# Patient Record
Sex: Female | Born: 1955 | Race: White | Hispanic: No | Smoking: Current every day smoker
Health system: Southern US, Community
[De-identification: ages and names within clinical notes are randomized; demographics above are authoritative.]

## PROBLEM LIST (undated history)

## (undated) DIAGNOSIS — Z720 Tobacco use: Secondary | ICD-10-CM

## (undated) DIAGNOSIS — G4730 Sleep apnea, unspecified: Secondary | ICD-10-CM

## (undated) DIAGNOSIS — R0600 Dyspnea, unspecified: Secondary | ICD-10-CM

## (undated) DIAGNOSIS — T7840XA Allergy, unspecified, initial encounter: Secondary | ICD-10-CM

## (undated) DIAGNOSIS — H269 Unspecified cataract: Secondary | ICD-10-CM

## (undated) DIAGNOSIS — C801 Malignant (primary) neoplasm, unspecified: Secondary | ICD-10-CM

## (undated) DIAGNOSIS — R3915 Urgency of urination: Secondary | ICD-10-CM

## (undated) DIAGNOSIS — E079 Disorder of thyroid, unspecified: Secondary | ICD-10-CM

## (undated) DIAGNOSIS — Z0389 Encounter for observation for other suspected diseases and conditions ruled out: Secondary | ICD-10-CM

## (undated) DIAGNOSIS — Z780 Asymptomatic menopausal state: Secondary | ICD-10-CM

## (undated) DIAGNOSIS — R002 Palpitations: Secondary | ICD-10-CM

## (undated) DIAGNOSIS — M1990 Unspecified osteoarthritis, unspecified site: Secondary | ICD-10-CM

## (undated) DIAGNOSIS — F419 Anxiety disorder, unspecified: Secondary | ICD-10-CM

## (undated) DIAGNOSIS — L309 Dermatitis, unspecified: Secondary | ICD-10-CM

## (undated) DIAGNOSIS — E039 Hypothyroidism, unspecified: Secondary | ICD-10-CM

## (undated) HISTORY — DX: Tobacco use: Z72.0

## (undated) HISTORY — DX: Dyspnea, unspecified: R06.00

## (undated) HISTORY — DX: Allergy, unspecified, initial encounter: T78.40XA

## (undated) HISTORY — PX: CATARACT EXTRACTION: SUR2

## (undated) HISTORY — DX: Dermatitis, unspecified: L30.9

## (undated) HISTORY — DX: Urgency of urination: R39.15

## (undated) HISTORY — DX: Palpitations: R00.2

## (undated) HISTORY — DX: Asymptomatic menopausal state: Z78.0

## (undated) HISTORY — DX: Encounter for observation for other suspected diseases and conditions ruled out: Z03.89

## (undated) HISTORY — DX: Anxiety disorder, unspecified: F41.9

## (undated) HISTORY — DX: Unspecified osteoarthritis, unspecified site: M19.90

## (undated) HISTORY — PX: WISDOM TOOTH EXTRACTION: SHX21

## (undated) HISTORY — DX: Unspecified cataract: H26.9

---

## 1980-05-14 HISTORY — PX: CHOLECYSTECTOMY: SHX55

## 1999-04-28 ENCOUNTER — Encounter: Admission: RE | Admit: 1999-04-28 | Discharge: 1999-04-28 | Payer: Self-pay | Admitting: Family Medicine

## 1999-04-28 ENCOUNTER — Encounter: Payer: Self-pay | Admitting: Family Medicine

## 1999-05-11 ENCOUNTER — Encounter: Payer: Self-pay | Admitting: Family Medicine

## 1999-05-11 ENCOUNTER — Encounter: Admission: RE | Admit: 1999-05-11 | Discharge: 1999-05-11 | Payer: Self-pay | Admitting: Family Medicine

## 1999-06-07 ENCOUNTER — Other Ambulatory Visit: Admission: RE | Admit: 1999-06-07 | Discharge: 1999-07-03 | Payer: Self-pay | Admitting: *Deleted

## 2000-05-20 ENCOUNTER — Encounter: Payer: Self-pay | Admitting: Family Medicine

## 2000-05-20 ENCOUNTER — Encounter: Admission: RE | Admit: 2000-05-20 | Discharge: 2000-05-20 | Payer: Self-pay | Admitting: Family Medicine

## 2001-01-20 ENCOUNTER — Other Ambulatory Visit: Admission: RE | Admit: 2001-01-20 | Discharge: 2001-01-20 | Payer: Self-pay | Admitting: *Deleted

## 2001-07-07 ENCOUNTER — Encounter: Admission: RE | Admit: 2001-07-07 | Discharge: 2001-07-07 | Payer: Self-pay | Admitting: Family Medicine

## 2001-07-07 ENCOUNTER — Encounter: Payer: Self-pay | Admitting: Family Medicine

## 2002-08-10 ENCOUNTER — Other Ambulatory Visit: Admission: RE | Admit: 2002-08-10 | Discharge: 2002-08-10 | Payer: Self-pay | Admitting: Obstetrics and Gynecology

## 2002-08-17 ENCOUNTER — Encounter: Payer: Self-pay | Admitting: Obstetrics and Gynecology

## 2002-08-17 ENCOUNTER — Encounter: Admission: RE | Admit: 2002-08-17 | Discharge: 2002-08-17 | Payer: Self-pay | Admitting: Obstetrics and Gynecology

## 2003-08-30 ENCOUNTER — Other Ambulatory Visit: Admission: RE | Admit: 2003-08-30 | Discharge: 2003-08-30 | Payer: Self-pay | Admitting: Obstetrics and Gynecology

## 2004-09-25 ENCOUNTER — Encounter: Admission: RE | Admit: 2004-09-25 | Discharge: 2004-09-25 | Payer: Self-pay | Admitting: Family Medicine

## 2004-10-02 ENCOUNTER — Encounter: Admission: RE | Admit: 2004-10-02 | Discharge: 2004-10-02 | Payer: Self-pay | Admitting: Family Medicine

## 2004-10-05 ENCOUNTER — Other Ambulatory Visit: Admission: RE | Admit: 2004-10-05 | Discharge: 2004-10-05 | Payer: Self-pay | Admitting: Family Medicine

## 2005-11-19 ENCOUNTER — Encounter: Admission: RE | Admit: 2005-11-19 | Discharge: 2005-11-19 | Payer: Self-pay | Admitting: Family Medicine

## 2005-12-17 ENCOUNTER — Other Ambulatory Visit: Admission: RE | Admit: 2005-12-17 | Discharge: 2005-12-17 | Payer: Self-pay | Admitting: Family Medicine

## 2007-08-20 ENCOUNTER — Ambulatory Visit (HOSPITAL_COMMUNITY): Admission: RE | Admit: 2007-08-20 | Discharge: 2007-08-20 | Payer: Self-pay | Admitting: Family Medicine

## 2008-07-23 ENCOUNTER — Other Ambulatory Visit: Admission: RE | Admit: 2008-07-23 | Discharge: 2008-07-23 | Payer: Self-pay | Admitting: Family Medicine

## 2008-08-23 ENCOUNTER — Ambulatory Visit (HOSPITAL_COMMUNITY): Admission: RE | Admit: 2008-08-23 | Discharge: 2008-08-23 | Payer: Self-pay | Admitting: Family Medicine

## 2009-09-01 ENCOUNTER — Other Ambulatory Visit: Admission: RE | Admit: 2009-09-01 | Discharge: 2009-09-01 | Payer: Self-pay | Admitting: Family Medicine

## 2009-09-01 ENCOUNTER — Encounter: Admission: RE | Admit: 2009-09-01 | Discharge: 2009-09-01 | Payer: Self-pay | Admitting: Family Medicine

## 2010-09-14 ENCOUNTER — Other Ambulatory Visit (HOSPITAL_COMMUNITY): Payer: Self-pay | Admitting: Family Medicine

## 2010-09-14 DIAGNOSIS — Z1231 Encounter for screening mammogram for malignant neoplasm of breast: Secondary | ICD-10-CM

## 2010-09-25 ENCOUNTER — Ambulatory Visit (HOSPITAL_COMMUNITY)
Admission: RE | Admit: 2010-09-25 | Discharge: 2010-09-25 | Disposition: A | Discharge status: Home / Self Care | Payer: Self-pay | Source: Ambulatory Visit | Attending: Family Medicine | Admitting: Family Medicine

## 2010-09-25 DIAGNOSIS — Z1231 Encounter for screening mammogram for malignant neoplasm of breast: Secondary | ICD-10-CM

## 2010-10-06 HISTORY — PX: OTHER SURGICAL HISTORY: SHX169

## 2011-08-16 ENCOUNTER — Ambulatory Visit (INDEPENDENT_AMBULATORY_CARE_PROVIDER_SITE_OTHER): Payer: BC Managed Care – PPO | Admitting: Internal Medicine

## 2011-08-16 ENCOUNTER — Encounter: Payer: Self-pay | Admitting: Internal Medicine

## 2011-08-16 VITALS — BP 150/84 | HR 85 | Temp 98.5°F | Ht 65.0 in | Wt 208.1 lb

## 2011-08-16 DIAGNOSIS — I10 Essential (primary) hypertension: Secondary | ICD-10-CM

## 2011-08-16 DIAGNOSIS — R002 Palpitations: Secondary | ICD-10-CM

## 2011-08-16 DIAGNOSIS — E669 Obesity, unspecified: Secondary | ICD-10-CM

## 2011-08-16 DIAGNOSIS — M1990 Unspecified osteoarthritis, unspecified site: Secondary | ICD-10-CM

## 2011-08-16 DIAGNOSIS — Z0000 Encounter for general adult medical examination without abnormal findings: Secondary | ICD-10-CM

## 2011-08-16 DIAGNOSIS — M129 Arthropathy, unspecified: Secondary | ICD-10-CM

## 2011-08-16 LAB — POCT URINALYSIS DIPSTICK
Bilirubin, UA: NEGATIVE
Blood, UA: NEGATIVE
Leukocytes, UA: NEGATIVE
Nitrite, UA: NEGATIVE
Urobilinogen, UA: NEGATIVE
pH, UA: 6

## 2011-08-16 NOTE — Progress Notes (Signed)
Subjective:    Patient ID: Jordan Rodgers, female    DOB: Dec 13, 1955, 56 y.o.   MRN: [id number]  HPI New pt here for first  Visit and comprehensive eval.  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  See BP - she reports she has been told that she has High blood pressure in the past but has not been on meds.  She is also concerned about 30# weight gain and palpitations of her hear.   She denies chest pain or pressure, no radiation down L arm or jaw, some DOE (she is a long time smoker), no diaphroesis or N/V. When asked if anyone has done a recent CXR on pt. , pt states "I dont' want a CXR   I don't give a damn what's in there."  She is asymptomatic from her elevated BP.  No headaches no chest pain no LE edema.  She does not want her blood drawn as it has recently been drawn at [facility]'s office.  She declines pap today stating she had one last year and it was normal. No history of abnormal pap smears per pt. report  No Known Allergies Past Medical History  Diagnosis Date  . Anxiety   . Menopause    Past Surgical History  Procedure Date  . Cholecystectomy 1982   History   Social History  . Marital Status: Married    Spouse Name: N/A    Number of Children: N/A  . Years of Education: N/A   Occupational History  . Not on file.   Social History Main Topics  . Smoking status: Current Everyday Smoker  . Smokeless tobacco: Never Used  . Alcohol Use: No  . Drug Use: No  . Sexually Active: Yes   Other Topics Concern  . Not on file   Social History Narrative  . No narrative on file   Family History  Problem Relation Age of Onset  . Hypertension Mother   . Thyroid disease Mother   . Hypertension Father   . Dementia Father     Lewy Body  . Thyroid disease Daughter   . Thyroid disease Maternal Aunt    There is no problem list on file for this patient.  Current Outpatient Prescriptions on File Prior to Visit    Medication Sig Dispense Refill  . Calcium Carbonate-Vitamin D (CALCIUM 600+D HIGH POTENCY) 600-400 MG-UNIT per tablet Take 1 tablet by mouth daily.      . clonazePAM (KLONOPIN) 1 MG tablet Take 0.25 mg by mouth 2 (two) times daily as needed.           Review of Systems See HPI    Objective:   Physical Exam  Physical Exam  Nursing note and vitals reviewed.  Constitutional: She is oriented to person, place, and time. She appears well-developed and well-nourished.  HENT:  Head: Normocephalic and atraumatic.  Right Ear: Tympanic membrane and ear canal normal. No drainage. Tympanic membrane is not injected and not erythematous.  Left Ear: Tympanic membrane and ear canal normal. No drainage. Tympanic membrane is not injected and not erythematous.  Nose: Nose normal. Right sinus exhibits no maxillary sinus tenderness and no frontal sinus tenderness. Left sinus exhibits no maxillary sinus tenderness and no frontal sinus tenderness.  Mouth/Throat: Oropharynx is clear and moist. No oral lesions. No oropharyngeal exudate.  Eyes: Conjunctivae and EOM are normal. Pupils are equal, round, and reactive to light.  Neck: Normal range of motion. Neck  supple. No JVD present. Carotid bruit is not present. No mass and no thyromegaly present.  Cardiovascular: Normal rate, regular rhythm, S1 normal, S2 normal and intact distal pulses. Exam reveals no gallop and no friction rub.  No murmur heard.  Pulses:  Carotid pulses are 2+ on the right side, and 2+ on the left side.  Dorsalis pedis pulses are 2+ on the right side, and 2+ on the left side.  No carotid bruit. No LE edema  Pulmonary/Chest: Breath sounds normal. She has no wheezes. She has no rales. She exhibits no tenderness.  Breasts:  No discrete masses no nipple discharge no axillary adenoapthy bilaterally Abdominal: Soft. Bowel sounds are normal. She exhibits no distension and no mass. There is no hepatosplenomegaly. There is no tenderness. There is  no CVA tenderness. Rectal: No mass guaiac neg M/S  Normal range of motion No active synovitisi to any jointhadenopathy:  She has no cervical adenopathy.  She has no axillary adenopathy.  Right: No inguinal and no supraclavicular adenopathy present.  Left: No inguinal and no supraclavicular adenopathy present.  Neurological: She is alert and oriented to person, place, and time. She has normal strength and normal reflexes. She displays no tremor. No cranial nerve deficit or sensory deficit. Coordination and gait normal.  Skin: Skin is warm and dry. No rash noted. No cyanosis. Nails show no clubbing.  Psychiatric: She has a normal mood and affect. Her speech is normal and behavior is normal. Cognition and memory are normal.     Assessment & Plan:  1)  Health Maintenance:  See Scanned HM sheet.  Discussed necessity of Tdap and she wishes to check with Health Department.  She states if she has not had one , she'll get one there.  Info given on Zoster and Dash diet.  She reports colonoscopy done 2012 and mammogram done 09/2010.  She declines pap today 2)  HTN:  Jordan Rodgers reports to me that she would like to try metprolol which she has at home as her husband take same med.  EKG show SR with slight QRS widening inferiorly and anteriorly.  I instructed pt to cal with the dose of metoprolol and we would discuss what dose to start.  She is to call upon arrival home or on Monday. 3)  Palpitations:  Decrease caffiene use.  Instructed if any LOC, chest pain to return to office for EKG  4)  Tobacco use  Not interested in cessation at this time 5)  Obesity:  Will need to discuss at subsequent visit.

## 2011-08-18 ENCOUNTER — Encounter: Payer: Self-pay | Admitting: Internal Medicine

## 2011-08-18 DIAGNOSIS — R002 Palpitations: Secondary | ICD-10-CM | POA: Insufficient documentation

## 2011-08-18 DIAGNOSIS — M1990 Unspecified osteoarthritis, unspecified site: Secondary | ICD-10-CM | POA: Insufficient documentation

## 2011-08-18 DIAGNOSIS — F419 Anxiety disorder, unspecified: Secondary | ICD-10-CM | POA: Insufficient documentation

## 2011-08-18 DIAGNOSIS — Z780 Asymptomatic menopausal state: Secondary | ICD-10-CM | POA: Insufficient documentation

## 2011-08-18 DIAGNOSIS — Z720 Tobacco use: Secondary | ICD-10-CM | POA: Insufficient documentation

## 2011-08-18 DIAGNOSIS — E669 Obesity, unspecified: Secondary | ICD-10-CM | POA: Insufficient documentation

## 2011-08-20 ENCOUNTER — Other Ambulatory Visit (HOSPITAL_COMMUNITY): Payer: Self-pay | Admitting: Physician Assistant

## 2011-08-20 DIAGNOSIS — Z1231 Encounter for screening mammogram for malignant neoplasm of breast: Secondary | ICD-10-CM

## 2011-10-01 ENCOUNTER — Ambulatory Visit (HOSPITAL_COMMUNITY)
Admission: RE | Admit: 2011-10-01 | Discharge: 2011-10-01 | Disposition: A | Discharge status: Home / Self Care | Payer: BC Managed Care – PPO | Source: Ambulatory Visit | Attending: Physician Assistant | Admitting: Physician Assistant

## 2011-10-01 DIAGNOSIS — Z1231 Encounter for screening mammogram for malignant neoplasm of breast: Secondary | ICD-10-CM

## 2011-11-05 ENCOUNTER — Encounter: Payer: Self-pay | Admitting: Internal Medicine

## 2011-11-17 ENCOUNTER — Encounter: Payer: Self-pay | Admitting: Internal Medicine

## 2011-11-17 DIAGNOSIS — M47812 Spondylosis without myelopathy or radiculopathy, cervical region: Secondary | ICD-10-CM | POA: Insufficient documentation

## 2012-09-02 ENCOUNTER — Other Ambulatory Visit (HOSPITAL_COMMUNITY): Payer: Self-pay | Admitting: Physician Assistant

## 2012-09-02 DIAGNOSIS — Z1231 Encounter for screening mammogram for malignant neoplasm of breast: Secondary | ICD-10-CM

## 2012-10-13 ENCOUNTER — Ambulatory Visit (HOSPITAL_COMMUNITY): Payer: BC Managed Care – PPO

## 2012-10-17 ENCOUNTER — Ambulatory Visit (HOSPITAL_COMMUNITY)
Admission: RE | Admit: 2012-10-17 | Discharge: 2012-10-17 | Disposition: A | Discharge status: Home / Self Care | Payer: BC Managed Care – PPO | Source: Ambulatory Visit | Attending: Physician Assistant | Admitting: Physician Assistant

## 2012-10-17 ENCOUNTER — Other Ambulatory Visit (HOSPITAL_COMMUNITY)
Admission: RE | Admit: 2012-10-17 | Discharge: 2012-10-17 | Disposition: A | Discharge status: Home / Self Care | Payer: BC Managed Care – PPO | Source: Ambulatory Visit | Attending: Family Medicine | Admitting: Family Medicine

## 2012-10-17 ENCOUNTER — Other Ambulatory Visit: Payer: Self-pay | Admitting: Physician Assistant

## 2012-10-17 DIAGNOSIS — Z1231 Encounter for screening mammogram for malignant neoplasm of breast: Secondary | ICD-10-CM | POA: Insufficient documentation

## 2012-10-17 DIAGNOSIS — Z124 Encounter for screening for malignant neoplasm of cervix: Secondary | ICD-10-CM | POA: Insufficient documentation

## 2013-07-27 ENCOUNTER — Telehealth: Payer: Self-pay | Admitting: Cardiology

## 2013-07-27 NOTE — Telephone Encounter (Signed)
New problem   Pt need to speak to Dr Gillian Shields concerning getting an appt for a friend Jamey Ripa 05/31/1949 that is here visiting and he lives at [facility]. She stated she has a referral from friends doctor. Please call pt she stated she new dr Gillian Shields and was a pt of his.

## 2013-07-28 NOTE — Telephone Encounter (Signed)
Spoke with patient she advised that she had spoken to Dr. Marlou Porch and her friend went to a walk-in clinic.

## 2013-10-07 ENCOUNTER — Other Ambulatory Visit (HOSPITAL_COMMUNITY): Payer: Self-pay | Admitting: Physician Assistant

## 2013-10-07 DIAGNOSIS — Z1231 Encounter for screening mammogram for malignant neoplasm of breast: Secondary | ICD-10-CM

## 2013-10-19 ENCOUNTER — Ambulatory Visit (HOSPITAL_COMMUNITY)
Admission: RE | Admit: 2013-10-19 | Discharge: 2013-10-19 | Disposition: A | Discharge status: Home / Self Care | Payer: BC Managed Care – PPO | Source: Ambulatory Visit | Attending: Physician Assistant | Admitting: Physician Assistant

## 2013-10-19 DIAGNOSIS — Z1231 Encounter for screening mammogram for malignant neoplasm of breast: Secondary | ICD-10-CM

## 2014-10-28 ENCOUNTER — Other Ambulatory Visit (HOSPITAL_COMMUNITY): Payer: Self-pay | Admitting: Physician Assistant

## 2014-10-28 DIAGNOSIS — Z1231 Encounter for screening mammogram for malignant neoplasm of breast: Secondary | ICD-10-CM

## 2014-11-18 ENCOUNTER — Ambulatory Visit (HOSPITAL_COMMUNITY)
Admission: RE | Admit: 2014-11-18 | Discharge: 2014-11-18 | Disposition: A | Discharge status: Home / Self Care | Payer: Self-pay | Source: Ambulatory Visit | Attending: Physician Assistant | Admitting: Physician Assistant

## 2014-11-18 DIAGNOSIS — Z1231 Encounter for screening mammogram for malignant neoplasm of breast: Secondary | ICD-10-CM

## 2015-11-28 ENCOUNTER — Telehealth (HOSPITAL_COMMUNITY): Payer: Self-pay | Admitting: *Deleted

## 2015-11-28 NOTE — Telephone Encounter (Signed)
Telephoned patient at home # and left message to return call to [facility] 

## 2016-01-06 ENCOUNTER — Ambulatory Visit (HOSPITAL_COMMUNITY): Payer: Self-pay

## 2016-01-23 ENCOUNTER — Other Ambulatory Visit: Payer: Self-pay | Admitting: Obstetrics and Gynecology

## 2016-01-23 DIAGNOSIS — Z1231 Encounter for screening mammogram for malignant neoplasm of breast: Secondary | ICD-10-CM

## 2016-02-03 ENCOUNTER — Encounter (HOSPITAL_COMMUNITY): Payer: Self-pay

## 2016-02-03 ENCOUNTER — Ambulatory Visit
Admission: RE | Admit: 2016-02-03 | Discharge: 2016-02-03 | Disposition: A | Discharge status: Home / Self Care | Payer: Self-pay | Source: Ambulatory Visit | Attending: Obstetrics and Gynecology | Admitting: Obstetrics and Gynecology

## 2016-02-03 ENCOUNTER — Ambulatory Visit (HOSPITAL_COMMUNITY)
Admission: RE | Admit: 2016-02-03 | Discharge: 2016-02-03 | Disposition: A | Discharge status: Home / Self Care | Payer: Self-pay | Source: Ambulatory Visit | Attending: Obstetrics and Gynecology | Admitting: Obstetrics and Gynecology

## 2016-02-03 VITALS — BP 118/70 | Temp 97.9°F | Ht 64.0 in | Wt 182.4 lb

## 2016-02-03 DIAGNOSIS — Z01419 Encounter for gynecological examination (general) (routine) without abnormal findings: Secondary | ICD-10-CM

## 2016-02-03 DIAGNOSIS — Z1231 Encounter for screening mammogram for malignant neoplasm of breast: Secondary | ICD-10-CM

## 2016-02-03 NOTE — Patient Instructions (Signed)
Explained breast self awareness to [employer]. Let patient know [facility]CP will cover Pap smears and HPV typing every 5 years unless has a history of abnormal Pap smears. Referred patient to the [facility] for a screening mammogram. Appointment scheduled for Friday, February 03, 2016 at 1040. Let patient know will follow up with her within the next couple weeks with results of Pap smear by phone. Informed patient that the [facility] will follow up with her within the next couple of weeks with results of mammogram by letter or phone. [place] verbalized understanding.  Sheilah Rayos, Arvil Chaco, RN 8:54 AM

## 2016-02-03 NOTE — Progress Notes (Signed)
No complaints today.   Pap Smear: Pap smear completed today. Last Pap smear was 10/27/2012 at [facility] and normal. Per patient has no history of an abnormal Pap smear. Last Pap smear result is in EPIC.  Physical exam: Breasts Breasts symmetrical. No skin abnormalities bilateral breasts. No nipple retraction bilateral breasts. No nipple discharge bilateral breasts. No lymphadenopathy. No lumps palpated bilateral breasts. No complaints of pain or tenderness on exam. Referred patient to the [facility] for a screening mammogram. Appointment scheduled for Friday, February 03, 2016 at 1040.  Pelvic/Bimanual   Ext Genitalia No lesions, no swelling and no discharge observed on external genitalia.         Vagina Vagina pink and normal texture. No lesions or discharge observed in vagina.          Cervix Cervix is present. Cervix pink and of normal texture. Cervix friable. No discharge observed.     Uterus Uterus is present and palpable. Uterus in normal position and normal size.        Adnexae Bilateral ovaries present and palpable. No tenderness on palpation.          Rectovaginal No rectal exam completed today since patient had no rectal complaints. No skin abnormalities observed on exam.    Smoking History: Patient is a current smoker. Discussed smoking cessation with patient. Referred patient to the [facility] Quitline and gave resources to free classes offered at [facility].  Patient Navigation: Patient education provided. Access to services provided for patient through [place] program.   Colorectal Cancer Screening: Per patient had a colonoscopy completed 5 years ago and was normal. No complaints today.

## 2016-02-06 LAB — CYTOLOGY - PAP

## 2016-02-07 ENCOUNTER — Telehealth (HOSPITAL_COMMUNITY): Payer: Self-pay | Admitting: *Deleted

## 2016-02-07 ENCOUNTER — Encounter (HOSPITAL_COMMUNITY): Payer: Self-pay | Admitting: *Deleted

## 2016-02-07 NOTE — Telephone Encounter (Signed)
Telephoned patient at home number and left message to return call to [facility] 

## 2016-02-13 ENCOUNTER — Encounter (HOSPITAL_COMMUNITY): Payer: Self-pay | Admitting: *Deleted

## 2016-02-13 ENCOUNTER — Telehealth (HOSPITAL_COMMUNITY): Payer: Self-pay | Admitting: *Deleted

## 2016-02-13 NOTE — Telephone Encounter (Signed)
Telephoned patient at home number and left message to return call to [facility]. Sending letter about pap smear results. Pap smear normal with negative HPV. Next pap smear due in five years.

## 2018-03-10 ENCOUNTER — Other Ambulatory Visit (HOSPITAL_COMMUNITY): Payer: Self-pay | Admitting: *Deleted

## 2018-03-10 DIAGNOSIS — Z1231 Encounter for screening mammogram for malignant neoplasm of breast: Secondary | ICD-10-CM

## 2018-06-17 ENCOUNTER — Encounter (HOSPITAL_COMMUNITY): Payer: Self-pay

## 2018-06-17 ENCOUNTER — Encounter (HOSPITAL_COMMUNITY): Payer: Self-pay | Admitting: *Deleted

## 2018-06-17 ENCOUNTER — Ambulatory Visit (HOSPITAL_COMMUNITY)
Admission: RE | Admit: 2018-06-17 | Discharge: 2018-06-17 | Disposition: A | Discharge status: Home / Self Care | Payer: No Typology Code available for payment source | Source: Ambulatory Visit | Attending: Obstetrics and Gynecology | Admitting: Obstetrics and Gynecology

## 2018-06-17 ENCOUNTER — Ambulatory Visit
Admission: RE | Admit: 2018-06-17 | Discharge: 2018-06-17 | Disposition: A | Discharge status: Home / Self Care | Payer: No Typology Code available for payment source | Source: Ambulatory Visit | Attending: Obstetrics and Gynecology | Admitting: Obstetrics and Gynecology

## 2018-06-17 VITALS — BP 120/82 | Wt 227.0 lb

## 2018-06-17 DIAGNOSIS — Z1239 Encounter for other screening for malignant neoplasm of breast: Secondary | ICD-10-CM

## 2018-06-17 DIAGNOSIS — Z1231 Encounter for screening mammogram for malignant neoplasm of breast: Secondary | ICD-10-CM

## 2018-06-17 HISTORY — DX: Disorder of thyroid, unspecified: E07.9

## 2018-06-17 NOTE — Patient Instructions (Signed)
Explained breast self awareness with Audry Riles. Patient did not need a Pap smear today due to last Pap smear and HPV typing was 02/03/2016. Let her know [facility] will cover Pap smears and HPV typing every 5 years unless has a history of abnormal Pap smears. Referred patient to the [facility] for a screening mammogram. Appointment scheduled for Tuesday, June 17, 2018 at 1130. Patient aware of appointment and will be there. Let patient know the Breast Center will follow up with her within the next couple weeks with results of mammogram by letter or phone. Discussed smoking cessation with patient. Referred to the [facility] Quitline and gave resources to the free smoking cessation classes at [facility]. Audry Riles verbalized understanding.  [place], Arvil Chaco, RN 12:21 PM

## 2018-06-17 NOTE — Progress Notes (Signed)
No complaints today.   Pap Smear: Pap smear not completed today. Last Pap smear was 02/03/2016 at [facility] and normal with negative HPV. Per patient has no history of an abnormal Pap smear. Last two Pap smear results are in Epic.  Physical exam: Breasts Breasts symmetrical. No skin abnormalities bilateral breasts. No nipple retraction bilateral breasts. No nipple discharge bilateral breasts. No lymphadenopathy. No lumps palpated bilateral breasts. No complaints of pain or tenderness on exam. Referred patient to the [facility] for a screening mammogram. Appointment scheduled for Tuesday, June 17, 2018 at 1130.        Pelvic/Bimanual No Pap smear completed today since last Pap smear and HPV typing was 02/03/2016. Pap smear not indicated per B[facility]CP guidelines.   Smoking History: Patient is a current smoker. Discussed smoking cessation with patient. Referred to the [facility] Quitline and gave resources to the free smoking cessation classes at [facility].  Patient Navigation: Patient education provided. Access to services provided for patient through [place] program.   Colorectal Cancer Screening: Per patient had a colonoscopy completed 7-8 years ago. No complaints today. FIT Test given to patient to complete and return to [facility].  Breast and Cervical Cancer Risk Assessment: Patient has a family history of her maternal aunt having breast cancer. Patient has no known genetic mutations or history of radiation treatment to the chest before age 62. Patient has no history of cervical dysplasia, immunocompromised, or DES exposure in-utero.  Risk Assessment    Risk Scores      06/17/2018   Last edited by: Armond Hang, LPN   5-year risk: 1.5 %   Lifetime risk: 7.7 %

## 2018-06-24 ENCOUNTER — Other Ambulatory Visit: Payer: Self-pay

## 2018-06-29 LAB — FECAL OCCULT BLOOD, IMMUNOCHEMICAL: FECAL OCCULT BLD: NEGATIVE

## 2018-07-09 ENCOUNTER — Encounter (HOSPITAL_COMMUNITY): Payer: Self-pay | Admitting: *Deleted

## 2018-07-09 NOTE — Progress Notes (Signed)
Letter mailed to patient with negative Fit Test results.  

## 2018-10-13 ENCOUNTER — Other Ambulatory Visit: Payer: Self-pay | Admitting: Orthopedic Surgery

## 2018-10-13 DIAGNOSIS — S4292XA Fracture of left shoulder girdle, part unspecified, initial encounter for closed fracture: Secondary | ICD-10-CM

## 2018-10-14 ENCOUNTER — Ambulatory Visit
Admission: RE | Admit: 2018-10-14 | Discharge: 2018-10-14 | Disposition: A | Discharge status: Home / Self Care | Payer: No Typology Code available for payment source | Source: Ambulatory Visit | Attending: Orthopedic Surgery | Admitting: Orthopedic Surgery

## 2018-10-14 ENCOUNTER — Other Ambulatory Visit: Payer: Self-pay

## 2018-10-14 DIAGNOSIS — S4292XA Fracture of left shoulder girdle, part unspecified, initial encounter for closed fracture: Secondary | ICD-10-CM

## 2019-07-15 ENCOUNTER — Other Ambulatory Visit: Payer: Self-pay | Admitting: Physician Assistant

## 2019-07-15 DIAGNOSIS — Z1231 Encounter for screening mammogram for malignant neoplasm of breast: Secondary | ICD-10-CM

## 2019-08-20 ENCOUNTER — Other Ambulatory Visit: Payer: Self-pay

## 2019-08-20 ENCOUNTER — Ambulatory Visit
Admission: RE | Admit: 2019-08-20 | Discharge: 2019-08-20 | Disposition: A | Discharge status: Home / Self Care | Payer: 59 | Source: Ambulatory Visit | Attending: Physician Assistant | Admitting: Physician Assistant

## 2019-08-20 DIAGNOSIS — Z1231 Encounter for screening mammogram for malignant neoplasm of breast: Secondary | ICD-10-CM

## 2020-07-11 ENCOUNTER — Other Ambulatory Visit: Payer: Self-pay | Admitting: Physician Assistant

## 2020-07-11 DIAGNOSIS — Z1231 Encounter for screening mammogram for malignant neoplasm of breast: Secondary | ICD-10-CM

## 2020-07-25 ENCOUNTER — Ambulatory Visit
Admission: RE | Admit: 2020-07-25 | Discharge: 2020-07-25 | Disposition: A | Discharge status: Home / Self Care | Payer: 59 | Source: Ambulatory Visit | Attending: Physician Assistant | Admitting: Physician Assistant

## 2020-07-25 ENCOUNTER — Other Ambulatory Visit: Payer: Self-pay | Admitting: Physician Assistant

## 2020-07-25 DIAGNOSIS — R0602 Shortness of breath: Secondary | ICD-10-CM

## 2020-07-26 ENCOUNTER — Other Ambulatory Visit: Payer: Self-pay | Admitting: Physician Assistant

## 2020-07-26 DIAGNOSIS — Z13820 Encounter for screening for osteoporosis: Secondary | ICD-10-CM

## 2020-08-01 ENCOUNTER — Ambulatory Visit (INDEPENDENT_AMBULATORY_CARE_PROVIDER_SITE_OTHER): Payer: 59 | Admitting: Interventional Cardiology

## 2020-08-01 ENCOUNTER — Other Ambulatory Visit: Payer: Self-pay

## 2020-08-01 ENCOUNTER — Encounter: Payer: Self-pay | Admitting: Interventional Cardiology

## 2020-08-01 VITALS — BP 132/90 | HR 66 | Ht 64.0 in | Wt 228.0 lb

## 2020-08-01 DIAGNOSIS — Z720 Tobacco use: Secondary | ICD-10-CM

## 2020-08-01 DIAGNOSIS — R0789 Other chest pain: Secondary | ICD-10-CM

## 2020-08-01 DIAGNOSIS — R0609 Other forms of dyspnea: Secondary | ICD-10-CM

## 2020-08-01 DIAGNOSIS — R0600 Dyspnea, unspecified: Secondary | ICD-10-CM | POA: Diagnosis not present

## 2020-08-01 DIAGNOSIS — G4733 Obstructive sleep apnea (adult) (pediatric): Secondary | ICD-10-CM | POA: Diagnosis not present

## 2020-08-01 DIAGNOSIS — R7303 Prediabetes: Secondary | ICD-10-CM | POA: Diagnosis not present

## 2020-08-01 DIAGNOSIS — Z01810 Encounter for preprocedural cardiovascular examination: Secondary | ICD-10-CM

## 2020-08-01 NOTE — Progress Notes (Signed)
Cardiology Office Note   Date:  08/01/2020   ID:  Jordan Rodgers, DOB 1955/06/15, MRN [id number]  PCP:  Lennie Odor, PA    No chief complaint on file.  DOE  [facility] Readings from Last 3 Encounters:  08/01/20 228 lb (103.4 kg)  06/17/18 227 lb (103 kg)  02/03/16 182 lb 6.4 oz (82.7 kg)       History of Present Illness: Jordan Rodgers is a 65 y.o. female who is being seen today for the evaluation of DOE at the request of [place], [place], [place].  In 2013, she had an echo, stress test. Which were ok.   She describes some SHOB while walking around her house.  She has had some rare chest tightness, but she feels this may be stress related.   She does need a knee replacement.  SHe has gained weight.  Activity level has decreased due to knee problems.  Not getting to 4 METS due to the knee problems.   She thinks that her heart is ok.    Denies : Chest pain. Dizziness. Leg edema. Nitroglycerin use. Orthopnea. Palpitations. Paroxysmal nocturnal dyspnea. Shortness of breath. Syncope.     Past Medical History:  Diagnosis Date  . Anxiety   . Arthritis   . Dyspnea   . Eczema   . Menopause   . Observation for suspected cardiovascular disease   . Palpitations   . Thyroid disease   . Tobacco use   . Urinary urgency     Past Surgical History:  Procedure Laterality Date  . CHOLECYSTECTOMY  1982  . colonscopy  10/06/10  . WISDOM TOOTH EXTRACTION       Current Outpatient Medications  Medication Sig Dispense Refill  . ALPRAZolam (XANAX) 0.5 MG tablet Take 0.5 mg by mouth 2 (two) times daily as needed.    Marland Kitchen aspirin EC 81 MG tablet Take 81 mg by mouth daily. Swallow whole.    . Calcium Carbonate-Vitamin D 600-400 MG-UNIT tablet Take 1 tablet by mouth daily.    . celecoxib (CELEBREX) 100 MG capsule Take 100 mg by mouth 2 (two) times daily.    . cetirizine (ZYRTEC) 10 MG tablet Take 10 mg by mouth daily.    . clonazePAM (KLONOPIN) 1 MG tablet Take 0.25 mg by  mouth 2 (two) times daily as needed.    Marland Kitchen escitalopram (LEXAPRO) 10 MG tablet Take 10 mg by mouth daily.    . metoprolol tartrate (LOPRESSOR) 25 MG tablet Take 25 mg by mouth 2 (two) times daily.    . Multiple Vitamin (MULTIVITAMIN) tablet Take 1 tablet by mouth daily.    . Probiotic Product (PROBIOTIC PO) Take by mouth daily.    Marland Kitchen thyroid (ARMOUR) 15 MG tablet Take 45 mg by mouth daily. Also takes an additional 30 mg on Wed and Sun     No current facility-administered medications for this visit.    Allergies:   Patient has no known allergies.    Social History:  The patient  reports that she has been smoking cigarettes. She has been smoking about 2.00 packs per day. She has never used smokeless tobacco. She reports previous alcohol use. She reports that she does not use drugs.   Family History:  The patient's family history includes Cancer in her father; Dementia in her father and mother; Diabetes in her mother; Hypertension in her father and mother; Thyroid disease in her daughter, maternal aunt, and mother.    ROS:  Please  see the history of present illness.   Otherwise, review of systems are positive for knee pain.   All other systems are reviewed and negative.    PHYSICAL EXAM: VS:  BP 132/90   Pulse 66   Ht 5\' 4"  (1.626 m)   Wt 228 lb (103.4 kg)   SpO2 96%   BMI 39.14 kg/m  , BMI Body mass index is 39.14 kg/m. GEN: Well nourished, well developed, in no acute distress  HEENT: normal  Neck: no JVD, carotid bruits, or masses Cardiac: RRR; no murmurs, rubs, or gallops,no edema  Respiratory:  clear to auscultation bilaterally, normal work of breathing GI: soft, nontender, nondistended, + BS MS: no deformity or atrophy  Skin: warm and dry, no rash Neuro:  Strength and sensation are intact Psych: euthymic mood, full affect   EKG:   The ekg ordered today demonstrates NSR, no ST changes   Recent Labs: No results found for requested labs within last 8760 hours.   Lipid  Panel No results found for: CHOL, TRIG, HDL, CHOLHDL, VLDL, LDLCALC, LDLDIRECT   Other studies Reviewed: Additional studies/ records that were reviewed today with results demonstrating: 2013 echo: Normal LV/RV function, mildly dilated aortic root.   ASSESSMENT AND PLAN:  1. DOE: Mild chest tightness as well- atypical.  May be anginal equivalent.  Plan for CTA coronaries.  HR is already slow. Continue regular dose of metoprolol.  2. Tobacco abuse:  Not trying to quit. Heavy tobacco abuse history.   3. Hypothyroid: Followed by Dr. Forde Dandy.  4. Prediabetic: High fiber diet.  Regular exercise will be beneficial. 5. OSA/Morbid obesity: Using CPAP.  Weight loss will also be beneficial.  6. Preoperative eval: CTA coronaries will help asses perioperative risk before TKR.    Current medicines are reviewed at length with the patient today.  The patient concerns regarding her medicines were addressed.  The following changes have been made:  No change  Labs/ tests ordered today include:  No orders of the defined types were placed in this encounter.   Recommend 150 minutes/week of aerobic exercise Low fat, low carb, high fiber diet recommended  Disposition:   FU for CTA.    Signed, Larae Grooms, MD  08/01/2020 4:10 PM    [facility] [place], [place], [place]  [address] Phone: [phone]; Fax: [phone]

## 2020-08-01 NOTE — Patient Instructions (Signed)
Medication Instructions:  Your physician recommends that you continue on your current medications as directed. Please refer to the Current Medication list given to you today.  *If you need a refill on your cardiac medications before your next appointment, please call your pharmacy*   Lab Work: Lab work to be done today--BMP If you have labs (blood work) drawn today and your tests are completely normal, you will receive your results only by: Marland Kitchen MyChart Message (if you have MyChart) OR . A paper copy in the mail If you have any lab test that is abnormal or we need to change your treatment, we will call you to review the results.   Testing/Procedures: Your physician has requested that you have cardiac CT. Cardiac computed tomography (CT) is a painless test that uses an x-ray machine to take clear, detailed pictures of your heart. For further information please visit [email or web address].[email or web address]. Please follow instruction sheet as given.      Follow-Up: At [facility], you and your health needs are our priority.  As part of our continuing mission to provide you with exceptional heart care, we have created designated Provider Care Teams.  These Care Teams include your primary Cardiologist (physician) and Advanced Practice Providers (APPs -  Physician Assistants and Nurse Practitioners) who all work together to provide you with the care you need, when you need it.  We recommend signing up for the patient portal called "MyChart".  Sign up information is provided on this After Visit Summary.  MyChart is used to connect with patients for Virtual Visits (Telemedicine).  Patients are able to view lab/test results, encounter notes, upcoming appointments, etc.  Non-urgent messages can be sent to your provider as well.   To learn more about what you can do with MyChart, go to [email or web address].    Your next appointment:   Based on CT results  The format for your next appointment:   In  Person  Provider:   You may see Larae Grooms, MD or one of the following Advanced Practice Providers on your designated Care Team:    Melina Copa, PA-C  Ermalinda Barrios, PA-C    Other Instructions Your cardiac CT will be scheduled at one of the below locations:   [facility] [address] [place], [place] [address] [phone]  [facility] [address] [place], [place] [address] [phone]  If scheduled at [facility], please arrive at the [facility] main entrance (entrance A) of [facility] 30 minutes prior to test start time. Proceed to the [facility] Radiology Department (first floor) to check-in and test prep.  If scheduled at [facility], please arrive 15 mins early for check-in and test prep.  Please follow these instructions carefully (unless otherwise directed):     On the Night Before the Test: . Be sure to Drink plenty of water. . Do not consume any caffeinated/decaffeinated beverages or chocolate 12 hours prior to your test. . Do not take any antihistamines 12 hours prior to your test.  On the Day of the Test: . Drink plenty of water until 1 hour prior to the test. . Do not eat any food 4 hours prior to the test. . You may take your regular medications prior to the test.  . Take metoprolol (Lopressor) two hours prior to test.  Dose is 25 mg . HOLD Furosemide/Hydrochlorothiazide morning of the test. . FEMALES- please wear underwire-free bra if available  After the Test: . Drink plenty of water. . After receiving IV contrast, you may experience a mild flushed feeling. This is normal. . On occasion, you may experience a mild rash up to 24 hours after the test. This is not dangerous. If this occurs, you can take Benadryl 25 mg and increase your fluid intake. . If you experience trouble breathing, this can be serious. If it is severe  call 911 IMMEDIATELY. If it is mild, please call our office. . If you take any of these medications: Glipizide/Metformin, Avandament, Glucavance, please do not take 48 hours after completing test unless otherwise instructed.   Once we have confirmed authorization from your insurance company, we will call you to set up a date and time for your test. Based on how quickly your insurance processes prior authorizations requests, please allow up to 4 weeks to be contacted for scheduling your Cardiac CT appointment. Be advised that routine Cardiac CT appointments could be scheduled as many as 8 weeks after your provider has ordered it.  For non-scheduling related questions, please contact the cardiac imaging nurse navigator should you have any questions/concerns: Marchia Bond, Cardiac Imaging Nurse Navigator Gordy Clement, Cardiac Imaging Nurse Navigator [facility] Direct Office Dial: [phone]   For scheduling needs, including cancellations and rescheduling, please call [place], [phone].

## 2020-08-03 LAB — BASIC METABOLIC PANEL
BUN/Creatinine Ratio: 16 (ref 12–28)
BUN: 13 mg/dL (ref 8–27)
CO2: 22 mmol/L (ref 20–29)
Calcium: 9.7 mg/dL (ref 8.7–10.3)
Chloride: 103 mmol/L (ref 96–106)
Creatinine, Ser: 0.82 mg/dL (ref 0.57–1.00)
Glucose: 97 mg/dL (ref 65–99)
Potassium: 4.3 mmol/L (ref 3.5–5.2)
Sodium: 142 mmol/L (ref 134–144)
eGFR: 81 mL/min/{1.73_m2} (ref >59)

## 2020-08-10 ENCOUNTER — Telehealth (HOSPITAL_COMMUNITY): Payer: Self-pay | Admitting: Emergency Medicine

## 2020-08-10 NOTE — Telephone Encounter (Signed)
Attempted to call patient regarding upcoming cardiac CT appointment. °Left message on voicemail with name and callback number °Lyla Jasek RN Navigator Cardiac Imaging ° [facility] °[phone] Office °[phone] Cell ° °

## 2020-08-11 ENCOUNTER — Ambulatory Visit
Admission: RE | Admit: 2020-08-11 | Discharge: 2020-08-11 | Disposition: A | Discharge status: Home / Self Care | Payer: 59 | Source: Ambulatory Visit | Attending: Interventional Cardiology | Admitting: Interventional Cardiology

## 2020-08-11 ENCOUNTER — Other Ambulatory Visit: Payer: Self-pay

## 2020-08-11 DIAGNOSIS — R0600 Dyspnea, unspecified: Secondary | ICD-10-CM | POA: Insufficient documentation

## 2020-08-11 DIAGNOSIS — R0609 Other forms of dyspnea: Secondary | ICD-10-CM

## 2020-08-11 MED ORDER — IOHEXOL 350 MG/ML SOLN
100.0000 mL | Freq: Once | INTRAVENOUS | Status: AC | PRN
  Administered 2020-08-11: 100 mL via INTRAVENOUS

## 2020-08-11 MED ORDER — NITROGLYCERIN 0.4 MG SL SUBL
0.8000 mg | SUBLINGUAL_TABLET | Freq: Once | SUBLINGUAL | Status: AC
  Administered 2020-08-11: 0.8 mg via SUBLINGUAL

## 2020-08-11 NOTE — Progress Notes (Signed)
Patient tolerated CT well. Drank water and diet soda after. Vital signs stable encourage to drink water throughout day.Reasons explained and verbalized understanding. Ambulated steady gait.

## 2020-08-15 ENCOUNTER — Telehealth: Payer: Self-pay | Admitting: Interventional Cardiology

## 2020-08-15 NOTE — Telephone Encounter (Signed)
Patient states she has an appointment after 10:15 AM.  She requested that if her call is returned after 10:15 AM, she would like a call back on her cell phone at [phone].

## 2020-08-15 NOTE — Telephone Encounter (Signed)
I spoke with patient and reviewed results with her.  She reports she saw her endocrinologist (Dr Forde Dandy) today and he went over results with her and prescribed cholesterol medication for her to take twice weekly.  Patient does not know the name of the medication.   Patient reports she has not been able to exercise due to knee issues and has gained weight.   She is to have knee replacement surgery soon.

## 2020-08-15 NOTE — Telephone Encounter (Signed)
I think f/u as needed is fine as long as she tolerates her lipid lowering therapy.   JV

## 2020-08-15 NOTE — Telephone Encounter (Signed)
Patient is returning call to discuss CT results. 

## 2020-08-17 NOTE — Telephone Encounter (Signed)
I spoke with patient and gave her information from Dr Irish Lack.  She is concerned about effect cholesterol medication may have on her thyroid.  She is asking if Zetia would be an alternative.  I explained Zetia alone did not usually control cholesterol and is not usually used alone unless patient cannot tolerate statin.   I asked her to follow up with Dr Forde Dandy regarding cholesterol medication and effect on thyroid.  Patient agreeable with this and will let us know if she is unable to tolerate statin

## 2020-08-31 ENCOUNTER — Other Ambulatory Visit: Payer: Self-pay

## 2020-08-31 ENCOUNTER — Other Ambulatory Visit: Payer: Self-pay | Admitting: Endocrinology

## 2020-08-31 ENCOUNTER — Ambulatory Visit
Admission: RE | Admit: 2020-08-31 | Discharge: 2020-08-31 | Disposition: A | Discharge status: Home / Self Care | Payer: 59 | Source: Ambulatory Visit | Attending: Physician Assistant | Admitting: Physician Assistant

## 2020-08-31 DIAGNOSIS — Z13820 Encounter for screening for osteoporosis: Secondary | ICD-10-CM

## 2020-08-31 DIAGNOSIS — Z1231 Encounter for screening mammogram for malignant neoplasm of breast: Secondary | ICD-10-CM

## 2021-01-18 ENCOUNTER — Other Ambulatory Visit: Payer: 59

## 2021-07-10 ENCOUNTER — Ambulatory Visit: Payer: Self-pay | Admitting: Orthopedic Surgery

## 2021-07-10 DIAGNOSIS — M1711 Unilateral primary osteoarthritis, right knee: Secondary | ICD-10-CM

## 2021-07-10 NOTE — Progress Notes (Signed)
Surgery orders requested via Epic inbox. °

## 2021-07-10 NOTE — H&P (Signed)
KNEE ARTHROPLASTY ADMISSION H&P  Patient ID: Jordan Rodgers MRN: [id number] DOB/AGE: 1955/11/28 66 y.o.  Chief Complaint: right knee pain.  Planned Procedure Date: 07-31-21 Medical and Cardiac Clearance by Dr. Reynold Bowen     HPI: Jordan Rodgers Dhana Totton is a 66 y.o. female who presents for evaluation of OA RIGHT KNEE. The patient has a history of pain and functional disability in the right knee due to arthritis and has failed non-surgical conservative treatments for greater than 12 weeks to include NSAID's and/or analgesics, corticosteriod injections, use of assistive devices, and activity modification.  Onset of symptoms was gradual, starting  30+  years ago with gradually worsening course since that time. The patient noted no past surgery on the right knee.  Patient currently rates pain at 8 out of 10 with activity. Patient has night pain, worsening of pain with activity and weight bearing, and pain that interferes with activities of daily living.  Patient has evidence of subchondral sclerosis, periarticular osteophytes, and joint space narrowing by imaging studies.  There is no active infection.  Past Medical History:  Diagnosis Date   Anxiety    Arthritis    Dyspnea    Eczema    Menopause    Observation for suspected cardiovascular disease    Palpitations    Thyroid disease    Tobacco use    Urinary urgency    Past Surgical History:  Procedure Laterality Date   CHOLECYSTECTOMY  1982   colonscopy  10/06/10   WISDOM TOOTH EXTRACTION     Allergies  Allergen Reactions   Shrimp Flavor     Hives   Prior to Admission medications   Medication Sig Start Date End Date Taking? Authorizing Provider  ALPRAZolam Duanne Moron) 0.5 MG tablet Take 0.5 mg by mouth 2 (two) times daily as needed. 07/14/20   [provider]  aspirin EC 81 MG tablet Take 81 mg by mouth daily. Swallow whole.    [provider]  Calcium Carbonate-Vitamin D 600-400 MG-UNIT tablet Take 1  tablet by mouth daily.    [provider]  celecoxib (CELEBREX) 100 MG capsule Take 100 mg by mouth 2 (two) times daily. 05/26/20   [provider]  cetirizine (ZYRTEC) 10 MG tablet Take 10 mg by mouth daily.    [provider]  clonazePAM (KLONOPIN) 1 MG tablet Take 0.25 mg by mouth 2 (two) times daily as needed.    [provider]  escitalopram (LEXAPRO) 10 MG tablet Take 10 mg by mouth daily. 05/24/20   [provider]  metoprolol tartrate (LOPRESSOR) 25 MG tablet Take 25 mg by mouth 2 (two) times daily.    [provider]  Multiple Vitamin (MULTIVITAMIN) tablet Take 1 tablet by mouth daily.    [provider]  Probiotic Product (PROBIOTIC PO) Take by mouth daily.    [provider]  thyroid (ARMOUR) 15 MG tablet Take 45 mg by mouth daily. Also takes an additional 30 mg on Wed and Sun    [provider]   Social History   Socioeconomic History   Marital status: Widowed    Spouse name: Not on file   Number of children: 3   Years of education: Not on file   Highest education level: Some college, no degree  Occupational History   Not on file  Tobacco Use   Smoking status: Every Day    Packs/day: 2.00    Types: Cigarettes   Smokeless tobacco: Never  Vaping Use  Vaping Use: Never used  Substance and Sexual Activity   Alcohol use: Not Currently    Comment: moderate   Drug use: No   Sexual activity: Yes  Other Topics Concern   Not on file  Social History Narrative   Not on file   Social Determinants of Health   Financial Resource Strain: Not on file  Food Insecurity: Not on file  Transportation Needs: Not on file  Physical Activity: Not on file  Stress: Not on file  Social Connections: Not on file   Family History  Problem Relation Age of Onset   Hypertension Father    Dementia Father        Lewy Body   Cancer Father        Prostate   Hypertension Mother    Thyroid disease Mother     Diabetes Mother    Dementia Mother    Thyroid disease Daughter    Thyroid disease Maternal Aunt     ROS: Currently denies lightheadedness, dizziness, Fever, chills, CP, SOB.   No personal history of DVT, PE, MI, or CVA. No loose teeth or dentures All other systems have been reviewed and were otherwise currently negative with the exception of those mentioned in the HPI and as above.  Objective: Vitals: Ht: 5'5" Wt: 177.8 lbs Temp: 97.7 BP: 124/71 Pulse: 41 O2 97% on room air.   Physical Exam: General: Alert, NAD.  Antalgic Gait  HEENT: EOMI, Good Neck Extension  Pulm: No increased work of breathing.  Clear B/L A/P w/o crackle or wheeze.  CV: RRR, No m/g/r appreciated  GI: soft, NT, ND. BS x 4 quadrants Neuro: CN II-XII grossly intact without focal deficit.  Sensation intact distally Skin: No lesions in the area of chief complaint MSK/Surgical Site: right knee w/o redness or effusion.  + medial JLT. ROM 0-120.  5/5 strength in extension and flexion.  +EHL/FHL.  NVI.  Pain with varus and valgus stress.    Imaging Review Plain radiographs demonstrate severe degenerative joint disease of the right knee.   The overall alignment ismild varus. The bone quality appears to be fair for age and reported activity level.  Preoperative templating of the joint replacement has been completed, documented, and submitted to the Operating Room personnel in order to optimize intra-operative equipment management.  Assessment: OA RIGHT KNEE Active Problems:   * No active hospital problems. *   Plan: Plan for Procedure(s): TOTAL KNEE ARTHROPLASTY  The patient history, physical exam, clinical judgement of the provider and imaging are consistent with end stage degenerative joint disease and total joint arthroplasty is deemed medically necessary. The treatment options including medical management, injection therapy, and arthroplasty were discussed at length. The risks and benefits of  Procedure(s): TOTAL KNEE ARTHROPLASTY were presented and reviewed.  The risks of nonoperative treatment, versus surgical intervention including but not limited to continued pain, aseptic loosening, stiffness, dislocation/subluxation, infection, bleeding, nerve injury, blood clots, cardiopulmonary complications, morbidity, mortality, among others were discussed. The patient verbalizes understanding and wishes to proceed with the plan.  Patient is being admitted for inpatient treatment for surgery, pain control, PT, prophylactic antibiotics, VTE prophylaxis, progressive ambulation, ADL's and discharge planning. She will spend the night in observation.   Dental prophylaxis discussed and recommended for 2 years postoperatively.  The patient does meet the criteria for TXA which will be used perioperatively.   ASA 81 mg BID will be used postoperatively for DVT prophylaxis in addition to SCDs, and early ambulation. Plan for Tylenol,  Celebrex, oxycodone for pain.   Prefers Phenergan for nausea and vomiting. Colace for constipation. Pharmacy- [facility] [place] The patient is planning to be discharged home with OPPT and into the care of her boyfriend Gustavo Lah who can be reached at [phone] or [phone] and her daughter Su Grand who can be reached at [phone]. Follow up appt 08-17-21 at 2:45pm     Alisa Graff Office [phone] 07/10/2021 3:07 PM

## 2021-07-17 NOTE — Patient Instructions (Addendum)
DUE TO COVID-19 ONLY ONE VISITOR IS ALLOWED TO COME WITH YOU AND STAY IN THE WAITING ROOM ONLY DURING PRE OP AND PROCEDURE.   **NO VISITORS ARE ALLOWED IN THE SHORT STAY AREA OR RECOVERY ROOM!!**  IF YOU WILL BE ADMITTED INTO THE HOSPITAL YOU ARE ALLOWED ONLY TWO SUPPORT PEOPLE DURING VISITATION HOURS ONLY (7 AM -8PM)   The support person(s) must pass our screening, gel in and out, and wear a mask at all times, including in the patients room. Patients must also wear a mask when staff or their support person are in the room. Visitors GUEST BADGE MUST BE WORN VISIBLY  One adult visitor may remain with you overnight and MUST be in the room by 8 P.M.  No visitors under the age of 51. Any visitor under the age of 46 must be accompanied by an adult.    COVID SWAB TESTING MUST BE COMPLETED ON:  07/27/21 at 9:15 am    (*ARRIVE AT YOUR APPOINTMENT TIME STAFF IS NOT HERE BEFORE 8AM!!!*)    Site: [facility] [address] [place] Enter: Main Entrance have a seat in the waiting area to the right of main entrance (DO NOT [place]!!!!!) Dial: [phone] to alert staff you have arrived  You are not required to quarantine, however you are required to wear a well-fitted mask when you are out and around people not in your household.  Hand Hygiene often Do NOT share personal items Notify your provider if you are in close contact with someone who has COVID or you develop fever 100.4 or greater, new onset of sneezing, cough, sore throat, shortness of breath or body aches.  [address] [place], [address], must go inside of the hospital, NOT A DRIVE THRU!  (Must self quarantine after testing. Follow instructions on handout.)       Your procedure is scheduled on: 07/31/21   Report to [facility] Main Entrance    Report to admitting at 6:50 AM   Call this number if you have problems the morning of surgery  [phone]   Do not eat food :After Midnight.   After Midnight you may have the following liquids until _7:30_____ AM/ DAY OF SURGERY  Water Black Coffee (sugar ok, NO MILK/CREAM OR CREAMERS)  Tea (sugar ok, NO MILK/CREAM OR CREAMERS) regular and decaf                             Plain Jell-O (NO RED)                                           Fruit ices (not with fruit pulp, NO RED)                                     Popsicles (NO RED)  Juice: apple, WHITE grape, WHITE cranberry Sports drinks like Gatorade (NO RED) Clear broth(vegetable,chicken,beef)               The day of surgery:  Drink ONE (1) Pre-Surgery Clear Ensure  at  7:15 AM the morning of surgery. Drink in one sitting. Do not sip.  This drink was given to you during your hospital  pre-op appointment visit. Nothing else to drink after completing the  Pre-Surgery Clear Ensure           If you have questions, please contact your surgeons office.   FOLLOW BOWEL PREP AND ANY ADDITIONAL PRE OP INSTRUCTIONS YOU RECEIVED FROM YOUR SURGEON'S OFFICE!!!     Oral Hygiene is also important to reduce your risk of infection.                                    Remember - BRUSH YOUR TEETH THE MORNING OF SURGERY WITH YOUR REGULAR TOOTHPASTE   Do NOT smoke after Midnight   Take these medicines the morning of surgery with A SIP OF WATER: Clonazapam, Lexapro, Metoprolol, Armour thyroid   Bring CPAP mask and tubing day of surgery.                              You may not have any metal on your body including hair pins, jewelry, and body piercing             Do not wear make-up, lotions, powders, perfumes/cologne, or deodorant  Do not wear nail polish including gel and S&S, artificial/acrylic nails, or any other type of covering on natural nails including finger and toenails. If you have artificial nails, gel coating, etc. that needs to be removed by a nail salon  please have this removed prior to surgery or surgery may need to be canceled/ delayed if the surgeon/ anesthesia feels like they are unable to be safely monitored.   Do not shave  48 hours prior to surgery.               Men may shave face and neck.   Do not bring valuables to the hospital. [place].   Contacts, dentures or bridgework may not be worn into surgery.   Bring small overnight bag day of surgery.    Patients discharged on the day of surgery will not be allowed to drive home.  Someone NEEDS to stay with you for the first 24 hours after anesthesia.   Special Instructions: Bring a copy of your healthcare power of attorney and living will documents   the day of surgery if you haven't scanned them before.              Please read over the following fact sheets you were given: IF YOU HAVE QUESTIONS ABOUT YOUR PRE-OP INSTRUCTIONS PLEASE CALL [phone]     [facility] - Preparing for Surgery Before surgery, you can play an important role.  Because skin is not sterile, your skin needs to be as free of germs as possible.  You can reduce the number of germs on your skin by washing with CHG (chlorahexidine gluconate) soap before surgery.  CHG is an antiseptic cleaner which kills germs and bonds with the skin to continue killing  germs even after washing. Please DO NOT use if you have an allergy to CHG or antibacterial soaps.  If your skin becomes reddened/irritated stop using the CHG and inform your nurse when you arrive at Short Stay. Do not shave (including legs and underarms) for at least 48 hours prior to the first CHG shower.  You may shave your face/neck.  Please follow these instructions carefully:  1.  Shower with CHG Soap the night before surgery and the  morning of surgery.  2.  If you choose to wash your hair, wash your hair first as usual with your normal  shampoo.  3.  After you shampoo, rinse your hair and body thoroughly to  remove the shampoo.                             4.  Use CHG as you would any other liquid soap.  You can apply chg directly to the skin and wash.  Gently with a scrungie or clean washcloth.  5.  Apply the CHG Soap to your body ONLY FROM THE NECK DOWN.   Do not use on face/ open                           Wound or open sores. Avoid contact with eyes, ears mouth and genitals (private parts).                       Wash face,  Genitals (private parts) with your normal soap.             6.  Wash thoroughly, paying special attention to the area where your  surgery  will be performed.  7.  Thoroughly rinse your body with warm water from the neck down.  8.  DO NOT shower/wash with your normal soap after using and rinsing off the CHG Soap.                9.  Pat yourself dry with a clean towel.            10.  Wear clean pajamas.            11.  Place clean sheets on your bed the night of your first shower and do not  sleep with pets. Day of Surgery : Do not apply any lotions/deodorants the morning of surgery.  Please wear clean clothes to the hospital/surgery center.  FAILURE TO FOLLOW THESE INSTRUCTIONS MAY RESULT IN THE CANCELLATION OF YOUR SURGERY    ________________________________________________________________________   Incentive Spirometer  An incentive spirometer is a tool that can help keep your lungs clear and active. This tool measures how well you are filling your lungs with each breath. Taking long deep breaths may help reverse or decrease the chance of developing breathing (pulmonary) problems (especially infection) following: A long period of time when you are unable to move or be active. BEFORE THE PROCEDURE  If the spirometer includes an indicator to show your best effort, your nurse or respiratory therapist will set it to a desired goal. If possible, sit up straight or lean slightly forward. Try not to slouch. Hold the incentive spirometer in an upright position. INSTRUCTIONS  FOR USE  Sit on the edge of your bed if possible, or sit up as far as you can in bed or on a chair. Hold the incentive spirometer in an upright position.  Breathe out normally. Place the mouthpiece in your mouth and seal your lips tightly around it. Breathe in slowly and as deeply as possible, raising the piston or the ball toward the top of the column. Hold your breath for 3-5 seconds or for as long as possible. Allow the piston or ball to fall to the bottom of the column. Remove the mouthpiece from your mouth and breathe out normally. Rest for a few seconds and repeat Steps 1 through 7 at least 10 times every 1-2 hours when you are awake. Take your time and take a few normal breaths between deep breaths. The spirometer may include an indicator to show your best effort. Use the indicator as a goal to work toward during each repetition. After each set of 10 deep breaths, practice coughing to be sure your lungs are clear. If you have an incision (the cut made at the time of surgery), support your incision when coughing by placing a pillow or rolled up towels firmly against it. Once you are able to get out of bed, walk around indoors and cough well. You may stop using the incentive spirometer when instructed by your caregiver.  RISKS AND COMPLICATIONS Take your time so you do not get dizzy or light-headed. If you are in pain, you may need to take or ask for pain medication before doing incentive spirometry. It is harder to take a deep breath if you are having pain. AFTER USE Rest and breathe slowly and easily. It can be helpful to keep track of a log of your progress. Your caregiver can provide you with a simple table to help with this. If you are using the spirometer at home, follow these instructions: [place] IF:  You are having difficultly using the spirometer. You have trouble using the spirometer as often as instructed. Your pain medication is not giving enough relief while using  the spirometer. You develop fever of 100.5 F (38.1 C) or higher. SEEK IMMEDIATE MEDICAL CARE IF:  You cough up bloody sputum that had not been present before. You develop fever of 102 F (38.9 C) or greater. You develop worsening pain at or near the incision site. MAKE SURE YOU:  Understand these instructions. Will watch your condition. Will get help right away if you are not doing well or get worse. Document Released: 09/10/2006 Document Revised: 07/23/2011 Document Reviewed: 11/11/2006 [facility] Patient Information 2014 [employer], [place].   ________________________________________________________________________ pcr

## 2021-07-18 ENCOUNTER — Encounter (HOSPITAL_COMMUNITY)
Admission: RE | Admit: 2021-07-18 | Discharge: 2021-07-18 | Disposition: A | Discharge status: Home / Self Care | Payer: Commercial Managed Care - HMO | Source: Ambulatory Visit | Attending: Orthopedic Surgery | Admitting: Orthopedic Surgery

## 2021-07-18 ENCOUNTER — Encounter (HOSPITAL_COMMUNITY): Payer: Self-pay

## 2021-07-18 ENCOUNTER — Other Ambulatory Visit: Payer: Self-pay

## 2021-07-18 VITALS — BP 144/76 | HR 64 | Temp 98.2°F | Resp 20 | Ht 65.0 in | Wt 174.0 lb

## 2021-07-18 DIAGNOSIS — Z01818 Encounter for other preprocedural examination: Secondary | ICD-10-CM | POA: Diagnosis present

## 2021-07-18 HISTORY — DX: Malignant (primary) neoplasm, unspecified: C80.1

## 2021-07-18 HISTORY — DX: Hypothyroidism, unspecified: E03.9

## 2021-07-18 HISTORY — DX: Sleep apnea, unspecified: G47.30

## 2021-07-18 LAB — CBC
HCT: 42.8 % (ref 36.0–46.0)
Hemoglobin: 14.2 g/dL (ref 12.0–15.0)
MCH: 30.6 pg (ref 26.0–34.0)
MCHC: 33.2 g/dL (ref 30.0–36.0)
MCV: 92.2 fL (ref 80.0–100.0)
Platelets: 185 10*3/uL (ref 150–400)
RBC: 4.64 MIL/uL (ref 3.87–5.11)
RDW: 12.8 % (ref 11.5–15.5)
WBC: 6.5 10*3/uL (ref 4.0–10.5)
nRBC: 0 % (ref 0.0–0.2)

## 2021-07-18 LAB — BASIC METABOLIC PANEL
Anion gap: 5 (ref 5–15)
BUN: 28 mg/dL — ABNORMAL HIGH (ref 8–23)
CO2: 28 mmol/L (ref 22–32)
Calcium: 9.2 mg/dL (ref 8.9–10.3)
Chloride: 105 mmol/L (ref 98–111)
Creatinine, Ser: 0.67 mg/dL (ref 0.44–1.00)
GFR, Estimated: >60 mL/min (ref >60)
Glucose, Bld: 116 mg/dL — ABNORMAL HIGH (ref 70–99)
Potassium: 4.2 mmol/L (ref 3.5–5.1)
Sodium: 138 mmol/L (ref 135–145)

## 2021-07-18 LAB — SURGICAL PCR SCREEN
MRSA, PCR: NEGATIVE
Staphylococcus aureus: NEGATIVE

## 2021-07-18 NOTE — Progress Notes (Signed)
COVID test-07/27/21 at 9:15 am ? ? ?Bowel prep reminder:NA ? ?PCP - Dr. Baldwin Crown ?Cardiologist - none ? ?Chest x-ray - 07/26/20-epic ?EKG - 08/01/20-epic ?Stress Test - 2013 ?ECHO - 2013 ?Cardiac Cath - na ?Pacemaker/ICD device last checked:NA ? ?Sleep Study - yes ?CPAP - yes ? ?Fasting Blood Sugar - NA ?Checks Blood Sugar _____ times a day ? ?Blood Thinner Instructions:ASA 81 mg/ Dr. Forde Dandy ?Aspirin Instructions:continue ?Last Dose: ? ?Anesthesia review: yes ? ?Patient denies shortness of breath, fever, cough and chest pain at PAT appointment ?Pt is a long time smoker but denies any SOB with activities.  ? ?Patient verbalized understanding of instructions that were given to them at the PAT appointment. Patient was also instructed that they will need to review over the PAT instructions again at home before surgery.  ?

## 2021-07-27 ENCOUNTER — Encounter (HOSPITAL_COMMUNITY): Payer: Managed Care, Other (non HMO)

## 2021-07-31 ENCOUNTER — Encounter (HOSPITAL_COMMUNITY): Payer: Self-pay | Admitting: Orthopedic Surgery

## 2021-07-31 ENCOUNTER — Ambulatory Visit (HOSPITAL_COMMUNITY): Payer: Commercial Managed Care - HMO | Admitting: Physician Assistant

## 2021-07-31 ENCOUNTER — Encounter (HOSPITAL_COMMUNITY)
Admission: RE | Disposition: A | Discharge status: Home / Self Care | Payer: Self-pay | Source: Ambulatory Visit | Attending: Orthopedic Surgery

## 2021-07-31 ENCOUNTER — Ambulatory Visit (HOSPITAL_BASED_OUTPATIENT_CLINIC_OR_DEPARTMENT_OTHER): Payer: Commercial Managed Care - HMO | Admitting: Certified Registered"

## 2021-07-31 ENCOUNTER — Ambulatory Visit (HOSPITAL_COMMUNITY)
Admission: RE | Admit: 2021-07-31 | Discharge: 2021-08-01 | Disposition: A | Discharge status: Home / Self Care | Payer: Commercial Managed Care - HMO | Source: Ambulatory Visit | Attending: Orthopedic Surgery | Admitting: Orthopedic Surgery

## 2021-07-31 ENCOUNTER — Other Ambulatory Visit: Payer: Self-pay

## 2021-07-31 ENCOUNTER — Ambulatory Visit (HOSPITAL_COMMUNITY): Payer: Commercial Managed Care - HMO

## 2021-07-31 DIAGNOSIS — I10 Essential (primary) hypertension: Secondary | ICD-10-CM | POA: Diagnosis not present

## 2021-07-31 DIAGNOSIS — M1711 Unilateral primary osteoarthritis, right knee: Secondary | ICD-10-CM | POA: Diagnosis present

## 2021-07-31 DIAGNOSIS — E039 Hypothyroidism, unspecified: Secondary | ICD-10-CM | POA: Diagnosis not present

## 2021-07-31 DIAGNOSIS — G4730 Sleep apnea, unspecified: Secondary | ICD-10-CM | POA: Insufficient documentation

## 2021-07-31 DIAGNOSIS — F17210 Nicotine dependence, cigarettes, uncomplicated: Secondary | ICD-10-CM | POA: Insufficient documentation

## 2021-07-31 HISTORY — PX: TOTAL KNEE ARTHROPLASTY: SHX125

## 2021-07-31 SURGERY — ARTHROPLASTY, KNEE, TOTAL
Anesthesia: Spinal | Site: Knee | Laterality: Right

## 2021-07-31 MED ORDER — SODIUM CHLORIDE (PF) 0.9 % IJ SOLN
INTRAMUSCULAR | Status: AC
  Filled 2021-07-31: qty 50

## 2021-07-31 MED ORDER — WATER FOR IRRIGATION, STERILE IR SOLN
Status: DC | PRN
  Administered 2021-07-31: 2000 mL

## 2021-07-31 MED ORDER — MIDAZOLAM HCL 2 MG/2ML IJ SOLN
1.0000 mg | Freq: Once | INTRAMUSCULAR | Status: AC
  Administered 2021-07-31: 1 mg via INTRAVENOUS
  Filled 2021-07-31: qty 2

## 2021-07-31 MED ORDER — OXYCODONE HCL 5 MG PO TABS
5.0000 mg | ORAL_TABLET | ORAL | Status: DC | PRN
  Administered 2021-07-31 (×2): 10 mg via ORAL
  Administered 2021-08-01: 5 mg via ORAL
  Administered 2021-08-01: 10 mg via ORAL
  Filled 2021-07-31 (×3): qty 2
  Filled 2021-07-31: qty 1

## 2021-07-31 MED ORDER — ATROPINE SULFATE 0.4 MG/ML IV SOLN
INTRAVENOUS | Status: AC
  Filled 2021-07-31: qty 1

## 2021-07-31 MED ORDER — MIDAZOLAM HCL 2 MG/2ML IJ SOLN
INTRAMUSCULAR | Status: DC | PRN
  Administered 2021-07-31 (×2): 1 mg via INTRAVENOUS

## 2021-07-31 MED ORDER — DEXAMETHASONE SODIUM PHOSPHATE 10 MG/ML IJ SOLN
8.0000 mg | Freq: Once | INTRAMUSCULAR | Status: AC
  Administered 2021-07-31: 8 mg via INTRAVENOUS

## 2021-07-31 MED ORDER — DOCUSATE SODIUM 100 MG PO CAPS
100.0000 mg | ORAL_CAPSULE | Freq: Two times a day (BID) | ORAL | Status: DC
  Administered 2021-07-31 – 2021-08-01 (×2): 100 mg via ORAL
  Filled 2021-07-31 (×2): qty 1

## 2021-07-31 MED ORDER — ZOLPIDEM TARTRATE 5 MG PO TABS: 5.0000 mg | ORAL_TABLET | Freq: Every evening | ORAL | Status: DC | PRN

## 2021-07-31 MED ORDER — METHOCARBAMOL 500 MG PO TABS
500.0000 mg | ORAL_TABLET | Freq: Four times a day (QID) | ORAL | Status: DC | PRN
  Administered 2021-07-31 – 2021-08-01 (×3): 500 mg via ORAL
  Filled 2021-07-31 (×3): qty 1

## 2021-07-31 MED ORDER — AMISULPRIDE (ANTIEMETIC) 5 MG/2ML IV SOLN: 10.0000 mg | Freq: Once | INTRAVENOUS | Status: DC | PRN

## 2021-07-31 MED ORDER — PHENYLEPHRINE HCL-NACL 20-0.9 MG/250ML-% IV SOLN
INTRAVENOUS | Status: DC | PRN
  Administered 2021-07-31: 20 ug/min via INTRAVENOUS

## 2021-07-31 MED ORDER — ACETAMINOPHEN 500 MG PO TABS: 1000.0000 mg | ORAL_TABLET | Freq: Three times a day (TID) | ORAL | 0 refills | Status: AC | PRN

## 2021-07-31 MED ORDER — ORAL CARE MOUTH RINSE: 15.0000 mL | Freq: Once | OROMUCOSAL | Status: AC

## 2021-07-31 MED ORDER — LACTATED RINGERS IV SOLN: INTRAVENOUS | Status: DC

## 2021-07-31 MED ORDER — ONDANSETRON HCL 4 MG/2ML IJ SOLN
INTRAMUSCULAR | Status: DC | PRN
  Administered 2021-07-31: 4 mg via INTRAVENOUS

## 2021-07-31 MED ORDER — ONDANSETRON HCL 4 MG/2ML IJ SOLN: 4.0000 mg | Freq: Four times a day (QID) | INTRAMUSCULAR | Status: DC | PRN

## 2021-07-31 MED ORDER — FENTANYL CITRATE PF 50 MCG/ML IJ SOSY: 25.0000 ug | PREFILLED_SYRINGE | INTRAMUSCULAR | Status: DC | PRN

## 2021-07-31 MED ORDER — BUPIVACAINE LIPOSOME 1.3 % IJ SUSP: 20.0000 mL | Freq: Once | INTRAMUSCULAR | Status: DC

## 2021-07-31 MED ORDER — CLONAZEPAM 0.5 MG PO TABS
0.5000 mg | ORAL_TABLET | Freq: Two times a day (BID) | ORAL | Status: DC
  Administered 2021-07-31 – 2021-08-01 (×2): 0.5 mg via ORAL
  Filled 2021-07-31 (×2): qty 1

## 2021-07-31 MED ORDER — ACETAMINOPHEN 325 MG PO TABS: 325.0000 mg | ORAL_TABLET | Freq: Four times a day (QID) | ORAL | Status: DC | PRN

## 2021-07-31 MED ORDER — SODIUM CHLORIDE (PF) 0.9 % IJ SOLN
INTRAMUSCULAR | Status: AC
  Filled 2021-07-31: qty 10

## 2021-07-31 MED ORDER — EPHEDRINE SULFATE-NACL 50-0.9 MG/10ML-% IV SOSY
PREFILLED_SYRINGE | INTRAVENOUS | Status: DC | PRN
Start: 2021-07-31 — End: 2021-07-31
  Administered 2021-07-31: 10 mg via INTRAVENOUS
  Administered 2021-07-31 (×2): 5 mg via INTRAVENOUS
  Administered 2021-07-31: 10 mg via INTRAVENOUS
  Administered 2021-07-31: 5 mg via INTRAVENOUS
  Administered 2021-07-31: 10 mg via INTRAVENOUS
  Administered 2021-07-31: 5 mg via INTRAVENOUS

## 2021-07-31 MED ORDER — SODIUM CHLORIDE 0.9 % IR SOLN
Status: DC | PRN
  Administered 2021-07-31: 3000 mL

## 2021-07-31 MED ORDER — DEXAMETHASONE SODIUM PHOSPHATE 10 MG/ML IJ SOLN
INTRAMUSCULAR | Status: AC
  Filled 2021-07-31: qty 1

## 2021-07-31 MED ORDER — CHLORHEXIDINE GLUCONATE 0.12 % MT SOLN
15.0000 mL | Freq: Once | OROMUCOSAL | Status: AC
  Administered 2021-07-31: 15 mL via OROMUCOSAL

## 2021-07-31 MED ORDER — GLYCOPYRROLATE 0.2 MG/ML IJ SOLN
INTRAMUSCULAR | Status: DC | PRN
  Administered 2021-07-31 (×2): .1 mg via INTRAVENOUS

## 2021-07-31 MED ORDER — OXYCODONE HCL 5 MG PO TABS: 5.0000 mg | ORAL_TABLET | ORAL | 0 refills | Status: AC | PRN

## 2021-07-31 MED ORDER — PROPOFOL 1000 MG/100ML IV EMUL
INTRAVENOUS | Status: AC
  Filled 2021-07-31: qty 100

## 2021-07-31 MED ORDER — CELECOXIB 200 MG PO CAPS
400.0000 mg | ORAL_CAPSULE | Freq: Once | ORAL | Status: AC
  Administered 2021-07-31: 400 mg via ORAL
  Filled 2021-07-31: qty 2

## 2021-07-31 MED ORDER — METOPROLOL TARTRATE 25 MG PO TABS
25.0000 mg | ORAL_TABLET | Freq: Two times a day (BID) | ORAL | Status: DC
Start: 2021-07-31 — End: 2021-08-01
  Administered 2021-07-31 – 2021-08-01 (×2): 25 mg via ORAL
  Filled 2021-07-31 (×2): qty 1

## 2021-07-31 MED ORDER — MELATONIN 5 MG PO TABS
2.5000 mg | ORAL_TABLET | Freq: Every day | ORAL | Status: DC
  Administered 2021-07-31: 5 mg via ORAL
  Filled 2021-07-31: qty 1

## 2021-07-31 MED ORDER — PHENOL 1.4 % MT LIQD: 1.0000 | OROMUCOSAL | Status: DC | PRN

## 2021-07-31 MED ORDER — LIDOCAINE 2% (20 MG/ML) 5 ML SYRINGE
INTRAMUSCULAR | Status: DC | PRN
Start: 2021-07-31 — End: 2021-07-31
  Administered 2021-07-31: 40 mg via INTRAVENOUS

## 2021-07-31 MED ORDER — CEFAZOLIN SODIUM-DEXTROSE 2-4 GM/100ML-% IV SOLN
2.0000 g | INTRAVENOUS | Status: AC
  Administered 2021-07-31: 2 g via INTRAVENOUS
  Filled 2021-07-31: qty 100

## 2021-07-31 MED ORDER — PROPOFOL 500 MG/50ML IV EMUL
INTRAVENOUS | Status: DC | PRN
  Administered 2021-07-31: 100 ug/kg/min via INTRAVENOUS

## 2021-07-31 MED ORDER — ONDANSETRON HCL 4 MG/2ML IJ SOLN
INTRAMUSCULAR | Status: AC
  Filled 2021-07-31: qty 2

## 2021-07-31 MED ORDER — ONDANSETRON HCL 4 MG PO TABS: 4.0000 mg | ORAL_TABLET | Freq: Four times a day (QID) | ORAL | Status: DC | PRN

## 2021-07-31 MED ORDER — POVIDONE-IODINE 10 % EX SWAB: 2.0000 "application " | Freq: Once | CUTANEOUS | Status: DC

## 2021-07-31 MED ORDER — ASPIRIN 81 MG PO CHEW
81.0000 mg | CHEWABLE_TABLET | Freq: Two times a day (BID) | ORAL | Status: DC
  Administered 2021-07-31 – 2021-08-01 (×2): 81 mg via ORAL
  Filled 2021-07-31 (×2): qty 1

## 2021-07-31 MED ORDER — BUPIVACAINE LIPOSOME 1.3 % IJ SUSP
INTRAMUSCULAR | Status: DC | PRN
  Administered 2021-07-31: 20 mL

## 2021-07-31 MED ORDER — ESCITALOPRAM OXALATE 10 MG PO TABS
10.0000 mg | ORAL_TABLET | Freq: Every day | ORAL | Status: DC
  Administered 2021-08-01: 10 mg via ORAL
  Filled 2021-07-31: qty 1

## 2021-07-31 MED ORDER — MIDAZOLAM HCL 2 MG/2ML IJ SOLN
INTRAMUSCULAR | Status: AC
  Filled 2021-07-31: qty 2

## 2021-07-31 MED ORDER — TRANEXAMIC ACID-NACL 1000-0.7 MG/100ML-% IV SOLN
1000.0000 mg | INTRAVENOUS | Status: AC
  Administered 2021-07-31: 1000 mg via INTRAVENOUS
  Filled 2021-07-31: qty 100

## 2021-07-31 MED ORDER — ASPIRIN EC 81 MG PO TBEC: 81.0000 mg | DELAYED_RELEASE_TABLET | Freq: Two times a day (BID) | ORAL | 0 refills | Status: AC

## 2021-07-31 MED ORDER — 0.9 % SODIUM CHLORIDE (POUR BTL) OPTIME
TOPICAL | Status: DC | PRN
  Administered 2021-07-31: 1000 mL

## 2021-07-31 MED ORDER — THYROID 60 MG PO TABS
60.0000 mg | ORAL_TABLET | Freq: Every day | ORAL | Status: DC
Start: 2021-08-01 — End: 2021-08-01
  Administered 2021-08-01: 60 mg via ORAL
  Filled 2021-07-31: qty 1

## 2021-07-31 MED ORDER — BUPIVACAINE LIPOSOME 1.3 % IJ SUSP
INTRAMUSCULAR | Status: AC
  Filled 2021-07-31: qty 20

## 2021-07-31 MED ORDER — PANTOPRAZOLE SODIUM 40 MG PO TBEC: 40.0000 mg | DELAYED_RELEASE_TABLET | Freq: Every day | ORAL | Status: DC

## 2021-07-31 MED ORDER — HYDROMORPHONE HCL 1 MG/ML IJ SOLN
0.5000 mg | INTRAMUSCULAR | Status: DC | PRN
  Administered 2021-07-31: 1 mg via INTRAVENOUS
  Filled 2021-07-31: qty 1

## 2021-07-31 MED ORDER — BUPIVACAINE IN DEXTROSE 0.75-8.25 % IT SOLN
INTRATHECAL | Status: DC | PRN
  Administered 2021-07-31: 1.8 mL via INTRATHECAL

## 2021-07-31 MED ORDER — METHOCARBAMOL 500 MG IVPB - SIMPLE MED
500.0000 mg | Freq: Four times a day (QID) | INTRAVENOUS | Status: DC | PRN
  Filled 2021-07-31: qty 50

## 2021-07-31 MED ORDER — MENTHOL 3 MG MT LOZG: 1.0000 | LOZENGE | OROMUCOSAL | Status: DC | PRN

## 2021-07-31 MED ORDER — ASCORBIC ACID 500 MG PO TABS
1000.0000 mg | ORAL_TABLET | Freq: Every day | ORAL | Status: DC
  Administered 2021-08-01: 1000 mg via ORAL
  Filled 2021-07-31: qty 2

## 2021-07-31 MED ORDER — PROMETHAZINE HCL 12.5 MG PO TABS: 12.5000 mg | ORAL_TABLET | Freq: Three times a day (TID) | ORAL | 0 refills | Status: DC | PRN

## 2021-07-31 MED ORDER — SODIUM CHLORIDE 0.9% FLUSH
INTRAVENOUS | Status: DC | PRN
  Administered 2021-07-31: 60 mL via INTRAVENOUS

## 2021-07-31 MED ORDER — PROPOFOL 10 MG/ML IV BOLUS
INTRAVENOUS | Status: DC | PRN
  Administered 2021-07-31 (×2): 10 mg via INTRAVENOUS
  Administered 2021-07-31: 30 mg via INTRAVENOUS

## 2021-07-31 MED ORDER — ACETAMINOPHEN 500 MG PO TABS
1000.0000 mg | ORAL_TABLET | Freq: Four times a day (QID) | ORAL | Status: AC
  Administered 2021-07-31 – 2021-08-01 (×4): 1000 mg via ORAL
  Filled 2021-07-31 (×4): qty 2

## 2021-07-31 MED ORDER — FENTANYL CITRATE PF 50 MCG/ML IJ SOSY
50.0000 ug | PREFILLED_SYRINGE | Freq: Once | INTRAMUSCULAR | Status: AC
  Administered 2021-07-31: 50 ug via INTRAVENOUS
  Filled 2021-07-31: qty 2

## 2021-07-31 MED ORDER — ROPIVACAINE HCL 5 MG/ML IJ SOLN
INTRAMUSCULAR | Status: DC | PRN
  Administered 2021-07-31: 20 mL via PERINEURAL

## 2021-07-31 MED ORDER — KETOROLAC TROMETHAMINE 15 MG/ML IJ SOLN
15.0000 mg | Freq: Four times a day (QID) | INTRAMUSCULAR | Status: AC
  Administered 2021-07-31 – 2021-08-01 (×3): 15 mg via INTRAVENOUS
  Filled 2021-07-31 (×3): qty 1

## 2021-07-31 MED ORDER — DIPHENHYDRAMINE HCL 12.5 MG/5ML PO ELIX: 12.5000 mg | ORAL_SOLUTION | ORAL | Status: DC | PRN

## 2021-07-31 MED ORDER — METHOCARBAMOL 500 MG PO TABS: 500.0000 mg | ORAL_TABLET | Freq: Three times a day (TID) | ORAL | 0 refills | Status: AC | PRN

## 2021-07-31 MED ORDER — ACETAMINOPHEN 500 MG PO TABS
1000.0000 mg | ORAL_TABLET | Freq: Once | ORAL | Status: AC
  Administered 2021-07-31: 1000 mg via ORAL
  Filled 2021-07-31: qty 2

## 2021-07-31 MED ORDER — CEFAZOLIN SODIUM-DEXTROSE 2-4 GM/100ML-% IV SOLN
2.0000 g | Freq: Four times a day (QID) | INTRAVENOUS | Status: AC
  Administered 2021-07-31 (×2): 2 g via INTRAVENOUS
  Filled 2021-07-31 (×2): qty 100

## 2021-07-31 MED ORDER — PROPOFOL 500 MG/50ML IV EMUL
INTRAVENOUS | Status: AC
  Filled 2021-07-31: qty 50

## 2021-07-31 SURGICAL SUPPLY — 55 items
BAG COUNTER SPONGE SURGICOUNT (BAG) IMPLANT
BLADE SAG 18X100X1.27 (BLADE) ×2 IMPLANT
BLADE SAW SAG 35X64 .89 (BLADE) ×2 IMPLANT
BNDG COHESIVE 3X5 TAN ST LF (GAUZE/BANDAGES/DRESSINGS) ×2 IMPLANT
BNDG ELASTIC 6X10 VLCR STRL LF (GAUZE/BANDAGES/DRESSINGS) ×2 IMPLANT
BNDG ELASTIC 6X15 VLCR STRL LF (GAUZE/BANDAGES/DRESSINGS) ×1 IMPLANT
BOWL SMART MIX CTS (DISPOSABLE) ×2 IMPLANT
CEMENT BONE R 1X40 (Cement) ×4 IMPLANT
CHLORAPREP W/TINT 26 (MISCELLANEOUS) ×4 IMPLANT
COVER SURGICAL LIGHT HANDLE (MISCELLANEOUS) ×2 IMPLANT
CUFF TOURN SGL QUICK 34 (TOURNIQUET CUFF) ×2
CUFF TRNQT CYL 34X4.125X (TOURNIQUET CUFF) ×1 IMPLANT
DERMABOND ADVANCED (GAUZE/BANDAGES/DRESSINGS)
DERMABOND ADVANCED .7 DNX12 (GAUZE/BANDAGES/DRESSINGS) ×1 IMPLANT
DRAPE INCISE IOBAN 85X60 (DRAPES) ×2 IMPLANT
DRAPE SHEET LG 3/4 BI-LAMINATE (DRAPES) ×2 IMPLANT
DRAPE U-SHAPE 47X51 STRL (DRAPES) ×2 IMPLANT
DRESSING AQUACEL AG SP 3.5X10 (GAUZE/BANDAGES/DRESSINGS) ×1 IMPLANT
DRSG AQUACEL AG SP 3.5X10 (GAUZE/BANDAGES/DRESSINGS) ×2
FEMUR CMT CR STD SZ 6 RT KNEE (Joint) ×2 IMPLANT
FEMUR CMTD CR STD SZ 6 RT KNEE (Joint) IMPLANT
GLOVE SRG 8 PF TXTR STRL LF DI (GLOVE) ×1 IMPLANT
GLOVE SURG ENC MOIS LTX SZ8 (GLOVE) ×4 IMPLANT
GLOVE SURG UNDER POLY LF SZ8 (GLOVE) ×2
GOWN STRL REUS W/TWL XL LVL3 (GOWN DISPOSABLE) ×2 IMPLANT
HANDPIECE INTERPULSE COAX TIP (DISPOSABLE) ×1
HDLS TROCR DRIL PIN KNEE 75 (PIN) ×4
HOOD PEEL AWAY FLYTE STAYCOOL (MISCELLANEOUS) ×6 IMPLANT
INSERT TIB ASF PS CD/6-7 RT 13 (Insert) ×1 IMPLANT
MANIFOLD NEPTUNE II (INSTRUMENTS) ×2 IMPLANT
MARKER SKIN DUAL TIP RULER LAB (MISCELLANEOUS) ×4 IMPLANT
NS IRRIG 1000ML POUR BTL (IV SOLUTION) ×2 IMPLANT
PACK TOTAL KNEE CUSTOM (KITS) ×2 IMPLANT
PIN DRILL HDLS TROCAR 75 4PK (PIN) IMPLANT
PROTECTOR NERVE ULNAR (MISCELLANEOUS) ×2 IMPLANT
SCREW HEADED 33MM KNEE (MISCELLANEOUS) ×2 IMPLANT
SET HNDPC FAN SPRY TIP SCT (DISPOSABLE) ×1 IMPLANT
SOLUTION IRRIG SURGIPHOR (IV SOLUTION) IMPLANT
SPIKE FLUID TRANSFER (MISCELLANEOUS) ×2 IMPLANT
STEM POLY PAT PLY 32M KNEE (Knees) ×1 IMPLANT
STEM TIBIA 5 DEG SZ D R KNEE (Knees) IMPLANT
STRIP CLOSURE SKIN 1/2X4 (GAUZE/BANDAGES/DRESSINGS) ×2 IMPLANT
SUT MNCRL AB 3-0 PS2 18 (SUTURE) ×2 IMPLANT
SUT STRATAFIX 0 PDS 27 VIOLET (SUTURE) ×2
SUT STRATAFIX PDO 1 14 VIOLET (SUTURE) ×2
SUT STRATFX PDO 1 14 VIOLET (SUTURE) ×1
SUT VIC AB 2-0 CT2 27 (SUTURE) ×4 IMPLANT
SUTURE STRATFX 0 PDS 27 VIOLET (SUTURE) ×1 IMPLANT
SUTURE STRATFX PDO 1 14 VIOLET (SUTURE) ×1 IMPLANT
SYR 50ML LL SCALE MARK (SYRINGE) ×2 IMPLANT
TIBIA STEM 5 DEG SZ D R KNEE (Knees) ×2 IMPLANT
TRAY FOLEY MTR SLVR 14FR STAT (SET/KITS/TRAYS/PACK) IMPLANT
TUBE SUCTION HIGH CAP CLEAR NV (SUCTIONS) ×2 IMPLANT
UNDERPAD 30X36 HEAVY ABSORB (UNDERPADS AND DIAPERS) ×2 IMPLANT
WRAP KNEE MAXI GEL POST OP (GAUZE/BANDAGES/DRESSINGS) IMPLANT

## 2021-07-31 NOTE — Anesthesia Procedure Notes (Signed)
Anesthesia Regional Block: Adductor canal block  ? ?Pre-Anesthetic Checklist: , timeout performed,  Correct Patient, Correct Site, Correct Laterality,  Correct Procedure, Correct Position, site marked,  Risks and benefits discussed,  Surgical consent,  Pre-op evaluation,  At surgeon's request and post-op pain management ? ?Laterality: Right ? ?Prep: chloraprep     ?  ?Needles:  ?Injection technique: Single-shot ? ?Needle Type: Echogenic Needle   ? ? ?Needle Length: 9cm  ?Needle Gauge: 21  ? ? ? ?Additional Needles: ? ? ?Procedures:,,,, ultrasound used (permanent image in chart),,    ?Narrative:  ?Start time: 07/31/2021 9:10 AM ?End time: 07/31/2021 9:15 AM ?Injection made incrementally with aspirations every 5 mL. ? ?Performed by: Personally  ?Anesthesiologist: Suzette Battiest, MD ? ? ? ? ?

## 2021-07-31 NOTE — Progress Notes (Signed)
AssistedDr. Rob Fitzgerald with right, ultrasound guided, adductor canal block. Side rails up, monitors on throughout procedure. See vital signs in flow sheet. Tolerated Procedure well.  

## 2021-07-31 NOTE — Anesthesia Preprocedure Evaluation (Signed)
Anesthesia Evaluation  ?Patient identified by MRN, date of birth, ID band ?Patient awake ? ? ? ?Reviewed: ?Allergy & Precautions, NPO status , Patient's Chart, lab work & pertinent test results ? ?Airway ?Mallampati: II ? ?TM Distance: >3 FB ?Neck ROM: Full ? ? ? Dental ? ?(+) Dental Advisory Given ?  ?Pulmonary ?sleep apnea , Current Smoker,  ?  ?breath sounds clear to auscultation ? ? ? ? ? ? Cardiovascular ?hypertension, Pt. on home beta blockers ? ?Rhythm:Regular Rate:Normal ? ? ?  ?Neuro/Psych ?negative neurological ROS ?   ? GI/Hepatic ?negative GI ROS, Neg liver ROS,   ?Endo/Other  ?Hypothyroidism  ? Renal/GU ?negative Renal ROS  ? ?  ?Musculoskeletal ? ?(+) Arthritis ,  ? Abdominal ?  ?Peds ? Hematology ?negative hematology ROS ?(+)   ?Anesthesia Other Findings ? ? Reproductive/Obstetrics ? ?  ? ? ? ? ? ? ? ? ? ? ? ? ? ?  ?  ? ? ? ? ? ? ? ? ?Lab Results  ?Component Value Date  ? WBC 6.5 07/18/2021  ? HGB 14.2 07/18/2021  ? HCT 42.8 07/18/2021  ? MCV 92.2 07/18/2021  ? PLT 185 07/18/2021  ? ?Lab Results  ?Component Value Date  ? CREATININE 0.67 07/18/2021  ? BUN 28 (H) 07/18/2021  ? NA 138 07/18/2021  ? K 4.2 07/18/2021  ? CL 105 07/18/2021  ? CO2 28 07/18/2021  ? ? ?Anesthesia Physical ?Anesthesia Plan ? ?ASA: 2 ? ?Anesthesia Plan: Spinal  ? ?Post-op Pain Management: Celebrex PO (pre-op)*, Tylenol PO (pre-op)* and Regional block*  ? ?Induction: Intravenous ? ?PONV Risk Score and Plan: 1 and Dexamethasone, Ondansetron and Treatment may vary due to age or medical condition ? ?Airway Management Planned: Natural Airway and Simple Face Mask ? ?Additional Equipment:  ? ?Intra-op Plan:  ? ?Post-operative Plan:  ? ?Informed Consent: I have reviewed the patients History and Physical, chart, labs and discussed the procedure including the risks, benefits and alternatives for the proposed anesthesia with the patient or authorized representative who has indicated his/her understanding  and acceptance.  ? ? ? ? ? ?Plan Discussed with: CRNA ? ?Anesthesia Plan Comments:   ? ? ? ? ? ? ?Anesthesia Quick Evaluation ? ?

## 2021-07-31 NOTE — Discharge Instructions (Signed)
INSTRUCTIONS AFTER JOINT REPLACEMENT  ? ?Remove items at home which could result in a fall. This includes throw rugs or furniture in walking pathways ?ICE to the affected joint every three hours while awake for 30 minutes at a time, for at least the first 3-5 days, and then as needed for pain and swelling.  Continue to use ice for pain and swelling. You may notice swelling that will progress down to the foot and ankle.  This is normal after surgery.  Elevate your leg when you are not up walking on it.   ?Continue to use the breathing machine you got in the hospital (incentive spirometer) which will help keep your temperature down.  It is common for your temperature to cycle up and down following surgery, especially at night when you are not up moving around and exerting yourself.  The breathing machine keeps your lungs expanded and your temperature down. ? ? ?DIET:  As you were doing prior to hospitalization, we recommend a well-balanced diet. ? ?DRESSING / WOUND CARE / SHOWERING ? ?Keep the surgical dressing until follow up.  The dressing is water proof, so you can shower without any extra covering.  IF THE DRESSING FALLS OFF or the wound gets wet inside, change the dressing with sterile gauze.  Please use good hand washing techniques before changing the dressing.  Do not use any lotions or creams on the incision until instructed by your surgeon.   ? ?ACTIVITY ? ?Increase activity slowly as tolerated, but follow the weight bearing instructions below.   ?No driving for 6 weeks or until further direction given by your physician.  You cannot drive while taking narcotics.  ?No lifting or carrying greater than 10 lbs. until further directed by your surgeon. ?Avoid periods of inactivity such as sitting longer than an hour when not asleep. This helps prevent blood clots.  ?You may return to work once you are authorized by your doctor.  ? ? ? ?WEIGHT BEARING  ? ?Weight bearing as tolerated with assist device (walker, cane,  etc) as directed, use it as long as suggested by your surgeon or therapist, typically at least 4-6 weeks. ? ? ?EXERCISES ? ?Results after joint replacement surgery are often greatly improved when you follow the exercise, range of motion and muscle strengthening exercises prescribed by your doctor. Safety measures are also important to protect the joint from further injury. Any time any of these exercises cause you to have increased pain or swelling, decrease what you are doing until you are comfortable again and then slowly increase them. If you have problems or questions, call your caregiver or physical therapist for advice.  ? ?Rehabilitation is important following a joint replacement. After just a few days of immobilization, the muscles of the leg can become weakened and shrink (atrophy).  These exercises are designed to build up the tone and strength of the thigh and leg muscles and to improve motion. Often times heat used for twenty to thirty minutes before working out will loosen up your tissues and help with improving the range of motion but do not use heat for the first two weeks following surgery (sometimes heat can increase post-operative swelling).  ? ?These exercises can be done on a training (exercise) mat, on the floor, on a table or on a bed. Use whatever works the best and is most comfortable for you.    Use music or television while you are exercising so that the exercises are a pleasant break in your   day. This will make your life better with the exercises acting as a break in your routine that you can look forward to.   Perform all exercises about fifteen times, three times per day or as directed.  You should exercise both the operative leg and the other leg as well. ? ?Exercises include: ?  ?Quad Sets - Tighten up the muscle on the front of the thigh (Quad) and hold for 5-10 seconds.   ?Straight Leg Raises - With your knee straight (if you were given a brace, keep it on), lift the leg to 60  degrees, hold for 3 seconds, and slowly lower the leg.  Perform this exercise against resistance later as your leg gets stronger.  ?Leg Slides: Lying on your back, slowly slide your foot toward your buttocks, bending your knee up off the floor (only go as far as is comfortable). Then slowly slide your foot back down until your leg is flat on the floor again.  ?Angel Wings: Lying on your back spread your legs to the side as far apart as you can without causing discomfort.  ?Hamstring Strength:  Lying on your back, push your heel against the floor with your leg straight by tightening up the muscles of your buttocks.  Repeat, but this time bend your knee to a comfortable angle, and push your heel against the floor.  You may put a pillow under the heel to make it more comfortable if necessary.  ? ?A rehabilitation program following joint replacement surgery can speed recovery and prevent re-injury in the future due to weakened muscles. Contact your doctor or a physical therapist for more information on knee rehabilitation.  ? ? ?CONSTIPATION ? ?Constipation is defined medically as fewer than three stools per week and severe constipation as less than one stool per week.  Even if you have a regular bowel pattern at home, your normal regimen is likely to be disrupted due to multiple reasons following surgery.  Combination of anesthesia, postoperative narcotics, change in appetite and fluid intake all can affect your bowels.  ? ?YOU MUST use at least one of the following options; they are listed in order of increasing strength to get the job done.  They are all available over the counter, and you may need to use some, POSSIBLY even all of these options:   ? ?Drink plenty of fluids (prune juice may be helpful) and high fiber foods ?Colace 100 mg by mouth twice a day  ?Senokot for constipation as directed and as needed Dulcolax (bisacodyl), take with full glass of water  ?Miralax (polyethylene glycol) once or twice a day as  needed. ? ?If you have tried all these things and are unable to have a bowel movement in the first 3-4 days after surgery call either your surgeon or your primary doctor.   ? ?If you experience loose stools or diarrhea, hold the medications until you stool forms back up.  If your symptoms do not get better within 1 week or if they get worse, check with your doctor.  If you experience "the worst abdominal pain ever" or develop nausea or vomiting, please contact the office immediately for further recommendations for treatment. ? ? ?ITCHING:  If you experience itching with your medications, try taking only a single pain pill, or even half a pain pill at a time.  You can also use Benadryl over the counter for itching or also to help with sleep.  ? ?TED HOSE STOCKINGS:  Use stockings on both   legs until for at least 2 weeks or as directed by physician office. They may be removed at night for sleeping. ? ?MEDICATIONS:  See your medication summary on the ?After Visit Summary? that nursing will review with you.  You may have some home medications which will be placed on hold until you complete the course of blood thinner medication.  It is important for you to complete the blood thinner medication as prescribed. ? ? ?Blood clot prevention (DVT Prophylaxis): After surgery you are at an increased risk for a blood clot. you were prescribed a blood thinner, Aspirin '81mg'$ , to be taken twice daily for a total of 4 weeks from surgery to help reduce your risk of getting a blood clot. This will help prevent a blood clot. Signs of a pulmonary embolus (blood clot in the lungs) include sudden short of breath, feeling lightheaded or dizzy, chest pain with a deep breath, rapid pulse rapid breathing. Signs of a blood clot in your arms or legs include new unexplained swelling and cramping, warm, red or darkened skin around the painful area. Please call the office or 911 right away if these signs or symptoms develop. ? ?PRECAUTIONS:  If you  experience chest pain or shortness of breath - call 911 immediately for transfer to the hospital emergency department.  ? ?If you develop a fever greater that 101 F, purulent drainage from wound, increased r

## 2021-07-31 NOTE — Anesthesia Procedure Notes (Signed)
Spinal ? ?Patient location during procedure: OR ?Start time: 07/31/2021 10:23 AM ?End time: 07/31/2021 10:28 AM ?Reason for block: surgical anesthesia ?Staffing ?Performed: anesthesiologist  ?Anesthesiologist: Suzette Battiest, MD ?Preanesthetic Checklist ?Completed: patient identified, IV checked, site marked, risks and benefits discussed, surgical consent, monitors and equipment checked, pre-op evaluation and timeout performed ?Spinal Block ?Patient position: sitting ?Prep: DuraPrep ?Patient monitoring: heart rate, cardiac monitor, continuous pulse ox and blood pressure ?Approach: midline ?Location: L4-5 ?Injection technique: single-shot ?Needle ?Needle type: Pencan  ?Needle gauge: 24 G ?Needle length: 9 cm ?Assessment ?Sensory level: T4 ?Events: CSF return and second provider ? ? ? ?

## 2021-07-31 NOTE — Transfer of Care (Signed)
Immediate Anesthesia Transfer of Care Note ? ?Patient: Jordan Rodgers ? ?Procedure(s) Performed: TOTAL KNEE ARTHROPLASTY (Right: Knee) ? ?Patient Location: PACU ? ?Anesthesia Type:Spinal ? ?Level of Consciousness: awake, alert  and patient cooperative ? ?Airway & Oxygen Therapy: Patient Spontanous Breathing and Patient connected to face mask oxygen ? ?Post-op Assessment: Report given to RN and Post -op Vital signs reviewed and stable ? ?Post vital signs: Reviewed and stable ? ?Last Vitals:  ?Vitals Value Taken Time  ?BP 107/71 07/31/21 1236  ?Temp    ?Pulse 62 07/31/21 1239  ?Resp 14 07/31/21 1239  ?SpO2 100 % 07/31/21 1239  ?Vitals shown include unvalidated device data. ? ?Last Pain:  ?Vitals:  ? 07/31/21 0917  ?TempSrc:   ?PainSc: 0-No pain  ?   ? ?  ? ?Complications: No notable events documented. ?

## 2021-07-31 NOTE — Anesthesia Procedure Notes (Signed)
Procedure Name: Huetter ?Date/Time: 07/31/2021 10:21 AM ?Performed by: Eben Burow, CRNA ?Pre-anesthesia Checklist: Patient identified, Emergency Drugs available, Suction available, Patient being monitored and Timeout performed ?Oxygen Delivery Method: Simple face mask ?Placement Confirmation: positive ETCO2 ? ? ? ? ?

## 2021-07-31 NOTE — Evaluation (Signed)
Physical Therapy Evaluation ?Patient Details ?Name: Jordan Rodgers ?MRN: [id number] ?DOB: 1955-06-17 ?Today's Date: 07/31/2021 ? ?History of Present Illness ? Patient is 66 y.o. female s/p Rt TKA on 07/31/21 with PMH significant for OA, anxiety, hypothyroidism.  ?Clinical Impression ? Jordan Rodgers is a 66 y.o. female POD 0 s/p Rt TKA. Patient reports independence with mobility at baseline. Patient is now limited by functional impairments (see PT problem list below) and requires min guard/assist for transfers and gait with RW. Patient was able to ambulate ~20 feet with RW and min assist. Patient instructed in exercise to facilitate ROM and circulation to manage edema and reduce risk of DVT. Patient will benefit from continued skilled PT interventions to address impairments and progress towards PLOF. Acute PT will follow to progress mobility and stair training in preparation for safe discharge home.    ?   ? ?Recommendations for follow up therapy are one component of a multi-disciplinary discharge planning process, led by the attending physician.  Recommendations may be updated based on patient status, additional functional criteria and insurance authorization. ? ?Follow Up Recommendations Follow physician's recommendations for discharge plan and follow up therapies ? ?  ?Assistance Recommended at Discharge Frequent or constant Supervision/Assistance  ?Patient can return home with the following ?   ? ?  ?Equipment Recommendations None recommended by PT  ?Recommendations for Other Services ?    ?  ?Functional Status Assessment Patient has had a recent decline in their functional status and demonstrates the ability to make significant improvements in function in a reasonable and predictable amount of time.  ? ?  ?Precautions / Restrictions Precautions ?Precautions: Fall ?Restrictions ?Weight Bearing Restrictions: No ?RLE Weight Bearing: Weight bearing as tolerated  ? ?  ? ?Mobility ? Bed  Mobility ?Overal bed mobility: Needs Assistance ?Bed Mobility: Supine to Sit ?  ?  ?Supine to sit: Min guard, HOB elevated ?  ?  ?General bed mobility comments: pt taking extra time and using bed rail. ?  ? ?Transfers ?Overall transfer level: Needs assistance ?Equipment used: Rolling walker (2 wheels) ?Transfers: Sit to/from Stand ?Sit to Stand: Min assist ?  ?  ?  ?  ?  ?General transfer comment: pt required cues for safe hand placement and technique with RW, close guard/light assist for rise from EOB. ?  ? ?Ambulation/Gait ?Ambulation/Gait assistance: Min assist, Min guard ?Gait Distance (Feet): 20 Feet ?Assistive device: Rolling walker (2 wheels) ?Gait Pattern/deviations: Step-to pattern, Decreased stride length, Decreased stance time - right, Decreased weight shift to right ?Gait velocity: decr ?  ?  ?General Gait Details: VC's for safe step to pattern and proximity to RW. no buckling at Rt knee, no LOB noted. assist needed to advance walker and direct gait path. ? ?Stairs ?  ?  ?  ?  ?  ? ?Wheelchair Mobility ?  ? ?Modified Rankin (Stroke Patients Only) ?  ? ?  ? ?Balance Overall balance assessment: Needs assistance ?Sitting-balance support: Feet supported ?Sitting balance-Leahy Scale: Good ?  ?  ?Standing balance support: During functional activity, Reliant on assistive device for balance, Bilateral upper extremity supported ?Standing balance-Leahy Scale: Poor ?  ?  ?  ?  ?  ?  ?  ?  ?  ?  ?  ?  ?   ? ? ? ?Pertinent Vitals/Pain Pain Assessment ?Pain Assessment: 0-10 ?Pain Score: 1  ?Pain Location: Rt knee ?Pain Descriptors / Indicators: Discomfort, Dull ?Pain Intervention(s): Limited activity within patient's tolerance, Monitored  during session, Premedicated before session, Repositioned, Ice applied  ? ? ?Home Living Family/patient expects to be discharged to:: Private residence ?Living Arrangements: Spouse/significant other ?Available Help at Discharge: Family ?Type of Home: House ?Home Access: Stairs to  enter ?Entrance Stairs-Rails: Can reach both (none on first step) ?Entrance Stairs-Number of Steps: 1+3 ?Alternate Level Stairs-Number of Steps: stays on main level ?Home Layout: Two level;Full bath on main level;Able to live on main level with bedroom/bathroom ?Home Equipment: Conservation officer, nature (2 wheels);Cane - single point;Shower seat;Grab bars - tub/shower ?   ?  ?Prior Function Prior Level of Function : Independent/Modified Independent ?  ?  ?  ?  ?  ?  ?  ?  ?  ? ? ?Hand Dominance  ? Dominant Hand: Right ? ?  ?Extremity/Trunk Assessment  ? Upper Extremity Assessment ?Upper Extremity Assessment: Overall WFL for tasks assessed ?  ? ?Lower Extremity Assessment ?Lower Extremity Assessment: RLE deficits/detail ?RLE Deficits / Details: good quad activaiton, no extensor lag with SLR ?RLE Sensation: WNL ?RLE Coordination: WNL ?  ? ?Cervical / Trunk Assessment ?Cervical / Trunk Assessment: Normal  ?Communication  ? Communication: No difficulties  ?Cognition Arousal/Alertness: Awake/alert ?Behavior During Therapy: [facility] for tasks assessed/performed ?Overall Cognitive Status: Within Functional Limits for tasks assessed ?  ?  ?  ?  ?  ?  ?  ?  ?  ?  ?  ?  ?  ?  ?  ?  ?General Comments: "Happy Easter"..."I'm high as a kite" ?  ?  ? ?  ?General Comments   ? ?  ?Exercises Total Joint Exercises ?Ankle Circles/Pumps: AROM, Both, 10 reps, Seated ?Quad Sets: AROM, Right, 5 reps, Seated  ? ?Assessment/Plan  ?  ?PT Assessment Patient needs continued PT services  ?PT Problem List Decreased strength;Decreased range of motion;Decreased activity tolerance;Decreased balance;Decreased mobility;Decreased knowledge of use of DME;Decreased knowledge of precautions ? ?   ?  ?PT Treatment Interventions DME instruction;Gait training;Stair training;Functional mobility training;Therapeutic activities;Therapeutic exercise;Balance training;Patient/family education   ? ?PT Goals (Current goals can be found in the Care Plan section)  ?Acute Rehab PT  Goals ?Patient Stated Goal: get recovered and be able to play with her grandkids ?PT Goal Formulation: With patient ?Time For Goal Achievement: 08/07/21 ?Potential to Achieve Goals: Good ? ?  ?Frequency 7X/week ?  ? ? ?Co-evaluation   ?  ?  ?  ?  ? ? ?  ?AM-PAC PT "6 Clicks" Mobility  ?Outcome Measure Help needed turning from your back to your side while in a flat bed without using bedrails?: A Little ?Help needed moving from lying on your back to sitting on the side of a flat bed without using bedrails?: A Little ?Help needed moving to and from a bed to a chair (including a wheelchair)?: A Little ?Help needed standing up from a chair using your arms (e.g., wheelchair or bedside chair)?: A Little ?Help needed to walk in hospital room?: A Little ?Help needed climbing 3-5 steps with a railing? : A Little ?6 Click Score: 18 ? ?  ?End of Session Equipment Utilized During Treatment: Gait belt ?Activity Tolerance: Patient tolerated treatment well ?Patient left: in chair;with call bell/phone within reach;with chair alarm set ?Nurse Communication: Mobility status ?PT Visit Diagnosis: Muscle weakness (generalized) (M62.81);Difficulty in walking, not elsewhere classified (R26.2) ?  ? ?Time: 7322-0254 ?PT Time Calculation (min) (ACUTE ONLY): 29 min ? ? ?Charges:   PT Evaluation ?$PT Eval Low Complexity: 1 Low ?PT Treatments ?$Gait Training: 8-22 mins ?  ?   ? ? ?  Gwynneth Albright PT, DPT ?Acute Rehabilitation Services ?Office [phone] ?Pager [phone]  ? ?Jacques Navy ?07/31/2021, 4:11 PM ? ?

## 2021-07-31 NOTE — Op Note (Signed)
DATE OF SURGERY:  07/31/2021 ?TIME: 12:07 PM ? ?PATIENT NAME:  Jordan Rodgers   ?AGE: 66 y.o.  ? ? ?PRE-OPERATIVE DIAGNOSIS: End-stage right knee osteoarthritis ? ?POST-OPERATIVE DIAGNOSIS:  Same ? ?PROCEDURE: Right total Knee Arthroplasty ? ?SURGEON:  Willaim Sheng, MD  ? ?ASSISTANT: Izola Price, RNFA, present and scrubbed throughout the case, critical for assistance with exposure, retraction, instrumentation, and closure. ? ? ?OPERATIVE IMPLANTS:  ?Implant Name Type Inv. Item Serial No. Manufacturer Lot No. LRB No. Used Action  ?CEMENT BONE R 1X40 - [id number] Cement CEMENT BONE R 1X40  [employer](ORTH,TRAU,BIO,SG) [id number] Right 2 Implanted  ?TIBIA STEM 5 DEG SZ D R KNEE - [id number] Knees TIBIA STEM 5 DEG SZ D R KNEE  [employer](ORTH,TRAU,BIO,SG) [id number] Right 1 Implanted  ?FEMUR CMT CR STD SZ 6 RT KNEE - [id number] Joint FEMUR CMT CR STD SZ 6 RT KNEE  [employer](ORTH,TRAU,BIO,SG) [id number] Right 1 Implanted  ?STEM POLY PAT PLY 37M KNEE - [id number] Knees STEM POLY PAT PLY 37M KNEE  [employer](ORTH,TRAU,BIO,SG) [id number] Right 1 Implanted  ?INSERT TIB ASF PS CD/6-7 RT 13 - [id number] Insert INSERT TIB ASF PS CD/6-7 RT 13  [employer](ORTH,TRAU,BIO,SG) [id number] Right 1 Implanted  ? ? ?  ?PREOPERATIVE INDICATIONS: ? ?Jordan Rodgers is a 66 y.o. year old female with end stage bone on bone degenerative arthritis of the knee who failed conservative treatment, including injections, antiinflammatories, activity modification, and assistive devices, and had significant impairment of their activities of daily living, and elected for Total Knee Arthroplasty.  ? ?The risks, benefits, and alternatives were discussed at length including but not limited to the risks of infection, bleeding, nerve injury, stiffness, blood clots, the need for revision surgery, cardiopulmonary complications, among others, and they were willing to proceed. ? ? ?ESTIMATED BLOOD LOSS: 25cc ? ?OPERATIVE DESCRIPTION: ? ?  Once adequate anesthesia, preoperative antibiotics, 2 gm of ancef,1 gm of Tranexamic Acid, and 8 mg of Decadron administered, the patient was positioned supine with a right thigh tourniquet placed.  The right lower extremity was prepped and draped in sterile fashion.  A time-  out was performed identifying the patient, planned procedure, and the appropriate extremity.  ?   ?The leg was  exsanguinated, tourniquet elevated to 250 mmHg.  A midline incision was  ? made followed by median parapatellar arthrotomy. Anterior horn of the medial meniscus was released and resected. A medial release was performed, the infrapatellar fat pad was resected with care taken to protect the patellar tendon. The suprapatellar fat was removed to exposed the distal anterior femur. The anterior horn of the lateral meniscus and ACL were released.   ? ?Following initial  exposure, attention was first to the femur.  The femoral  ? canal was opened with a drill, irrigated to try to prevent fat emboli.  An  ? intramedullary rod was passed set at 5 degrees valgus, 10 mm. The distal femur was resected.  Following this resection, the tibia was  ? subluxated anteriorly.  Using the extramedullary guide, 10 mm of bone was resected off the proximal lateral tibia.  We confirmed the gap would be  ? stable medially and laterally with a size 10 spacer block as well as confirmed that the tibial cut was perpendicular in the coronal plane, checking with an alignment rod.  ? ? Once this was done, the posterior femoral referencing femoral sizer was placed under to the posterior condyles with 3 degrees of external rotational. The femur  was sized to be a size 6 in the anterior-  posterior dimension. The  ? anterior, posterior, and  chamfer cuts were made without difficulty nor  ? notching making certain that I was along the anterior cortex to help  ? with flexion gap stability.  ?Next a laminar spreader was placed with the knee in flexion and the medial lateral  menisci were resected.  5 cc of the Exparel mixture was injected in the medial side of the back of the knee and 3 cc in the lateral side.  No posterior osteophytes appreciated. ?    ? At this point, the tibia was sized to be a size D.  The size D tray was  ? then pinned in position. Trial reduction was now carried with a 6 femur, ? D tibia, a 12 mm MC insert.  The knee had full extension and was stable to varus valgus stress in extension.  The knee was slightly tight in flexion and the PCL was partially released.  ? ?Attention was next directed to the patella.  Precut  measurement was noted to be 21 mm.  I resected down to 13 mm and used a  35m patellar button to restore patellar height as well as cover the cut surface.  ? ? ? The patella lug holes were drilled and a 32 mm patella poly trial was placed. ? ?  The knee was brought to full extension with good flexion stability with the patella  ? tracking through the trochlea without application of pressure.   ? ? ?Next the femoral component was again assessed and determined to be seated and appropriately lateralized.  The femoral lug holes were drilled.  The femoral component was then removed.Tibial component was again assessed and felt to be seated and appropriately rotated with the medial third of the tubercle. The tibia was then drilled, and keel punched.   ? ? Final components were  opened and regular cement was mixed.   ?   ?Final implants were then  cemented onto cleaned and dried cut surfaces of bone with the knee brought to extension with a 13 mm MC poly.  The knee was irrigated with sterile Betadine diluted in saline as well as pulse lavage normal saline. The synovial lining was  then injected a dilute Exparel.  ?   ? Once the cement had fully cured, excess cement was removed  ? throughout the knee.  I confirmed that I was satisfied with the range of  ? motion and stability, and the final 13 mm MC poly insert was chosen.  It was  ? placed into the knee.  ?    ?   ? The tourniquet had been let down at 69 minutes.  No significant  ? hemostasis was required.  The medial parapatellar arthrotomy was then reapproximated using #1 Stratafix sutures with the knee  in flexion.  The  ? remaining wound was closed with 0 stratafix, 2-0 Vicryl, and running 3-0 Monocryl.  ? The knee was cleaned, dried, dressed sterilely using Dermabond and  ? Aquacel dressing.  The patient was then  brought to recovery room in stable condition, tolerating the procedure  well. There were no complications. ? ? ?Post op recs: ?WB: WBAT ?Abx: ancef x23 hours post op ?Imaging: PACU xrays ?DVT prophylaxis: Aspirin '81mg'$  BID x4 weeks ?Follow up: 2 weeks after surgery [facility]  ?[place] [address] [place]  [address]  ?Office Phone: [phone] ? ?Charlies Constable, MD ?Orthopaedic Surgery ? ? ? ? ? ? ? ? ? ? ? ? ?  ?

## 2021-07-31 NOTE — Interval H&P Note (Signed)
The patient has been re-examined, and the chart reviewed, and there have been no interval changes to the documented history and physical.   ? ?Plan for R TKA today. ? ?The operative side was examined and the patient was confirmed to have. Sens DPN, SPN, TN intact, Motor EHL, ext, flex 5/5, and DP 2+, PT 2+, No significant edema. ? ? ?The risks, benefits, and alternatives have been discussed at length with patient, and the patient is willing to proceed.  Right knee marked. Consent has been signed. ? ?

## 2021-07-31 NOTE — Plan of Care (Cosign Needed)
Completed during shift assessment. ?

## 2021-07-31 NOTE — Progress Notes (Signed)
Orthopedic Tech Progress Note ?Patient Details:  ?Eimi Viney ?02/05/1956 ?[id number] ? ?Ortho Devices ?Type of Ortho Device: Bone foam zero knee ?Ortho Device/Splint Interventions: Application ?  ?Post Interventions ?Patient Tolerated: Well ?Instructions Provided: Care of device ? ?[place] Pink ?07/31/2021, 12:52 PM ? ?

## 2021-08-01 ENCOUNTER — Encounter (HOSPITAL_COMMUNITY): Payer: Self-pay | Admitting: Orthopedic Surgery

## 2021-08-01 DIAGNOSIS — M1711 Unilateral primary osteoarthritis, right knee: Secondary | ICD-10-CM | POA: Diagnosis not present

## 2021-08-01 LAB — BASIC METABOLIC PANEL
Anion gap: 9 (ref 5–15)
BUN: 23 mg/dL (ref 8–23)
CO2: 26 mmol/L (ref 22–32)
Calcium: 9 mg/dL (ref 8.9–10.3)
Chloride: 103 mmol/L (ref 98–111)
Creatinine, Ser: 0.67 mg/dL (ref 0.44–1.00)
GFR, Estimated: >60 mL/min (ref >60)
Glucose, Bld: 122 mg/dL — ABNORMAL HIGH (ref 70–99)
Potassium: 4.1 mmol/L (ref 3.5–5.1)
Sodium: 138 mmol/L (ref 135–145)

## 2021-08-01 LAB — CBC
HCT: 37.4 % (ref 36.0–46.0)
Hemoglobin: 12.4 g/dL (ref 12.0–15.0)
MCH: 30.7 pg (ref 26.0–34.0)
MCHC: 33.2 g/dL (ref 30.0–36.0)
MCV: 92.6 fL (ref 80.0–100.0)
Platelets: 158 10*3/uL (ref 150–400)
RBC: 4.04 MIL/uL (ref 3.87–5.11)
RDW: 12.5 % (ref 11.5–15.5)
WBC: 11.3 10*3/uL — ABNORMAL HIGH (ref 4.0–10.5)
nRBC: 0 % (ref 0.0–0.2)

## 2021-08-01 NOTE — TOC Transition Note (Signed)
Transition of Care (TOC) - CM/SW Discharge Note ? ? ?Patient Details  ?Name: Jordan Rodgers ?MRN: [id number] ?Date of Birth: 1955-09-01 ? ?Transition of Care (TOC) CM/SW Contact:  ?Vandy Fong, LCSW ?Phone Number: ?08/01/2021, 10:29 AM ? ? ?Clinical Narrative:    ?Met with pt and confirming she has all needed DME at home. Plan for OPPT at [facility] PT.  No TOC needs. ? ? ?Final next level of care: OP Rehab ?Barriers to Discharge: No Barriers Identified ? ? ?Patient Goals and CMS Choice ?Patient states their goals for this hospitalization and ongoing recovery are:: return home ?  ?  ? ?Discharge Placement ?  ?           ?  ?  ?  ?  ? ?Discharge Plan and Services ?  ?  ?           ?DME Arranged: N/A ?DME Agency: NA ?  ?  ?  ?  ?  ?  ?  ?  ? ?Social Determinants of Health (SDOH) Interventions ?  ? ? ?Readmission Risk Interventions ?No flowsheet data found. ? ? ? ? ?

## 2021-08-01 NOTE — Progress Notes (Signed)
? ? ? ?  Subjective: ? ?Patient reports pain as mild.  Denies distal n/t. In good spirits this morning. No issues overnight. Worked well with PT yesterday. Plan to dc home today if clears PT. ? ?Objective:  ? ?VITALS:   ?Vitals:  ? 07/31/21 1601 07/31/21 1951 08/01/21 0210 08/01/21 0601  ?BP: 119/66 (!) 117/57 (!) 103/53 (!) 107/57  ?Pulse: (!) 44 (!) 50 (!) 51 (!) 44  ?Resp: _0 ?Temp: 97.6 ?F (36.4 ?C) 98.6 ?F (37 ?C) 99 ?F (37.2 ?C) 98.2 ?F (36.8 ?C)  ?TempSrc: Oral Oral Oral Oral  ?SpO2: 95% 93% 94% 93%  ?Weight:      ?Height:      ? ? ?Sensation intact distally ?Intact pulses distally ?Dorsiflexion/Plantar flexion intact ?Incision: dressing C/D/I ?Compartment soft ? ? ?Lab Results  ?Component Value Date  ? WBC 11.3 (H) 08/01/2021  ? HGB 12.4 08/01/2021  ? HCT 37.4 08/01/2021  ? MCV 92.6 08/01/2021  ? PLT 158 08/01/2021  ? ?BMET ?   ?Component Value Date/Time  ? NA 138 08/01/2021 0302  ? NA 142 08/01/2020 1644  ? K 4.1 08/01/2021 0302  ? CL 103 08/01/2021 0302  ? CO2 26 08/01/2021 0302  ? GLUCOSE 122 (H) 08/01/2021 0302  ? BUN 23 08/01/2021 0302  ? BUN 13 08/01/2020 1644  ? CREATININE 0.67 08/01/2021 0302  ? CALCIUM 9.0 08/01/2021 0302  ? EGFR 81 08/01/2020 1644  ? GFRNONAA >60 08/01/2021 0302  ? ? ? ? ?Xray: R knee xrays show componetns in good position without adverse features ? ?Assessment/Plan: ?1 Day Post-Op  ? ?Principal Problem: ?  Localized osteoarthritis of right knee ? ?S/p R TKA 3/20 ? ?Post op recs: ?WB: WBAT ?Abx: ancef x23 hours post op ?Imaging: PACU xrays ?DVT prophylaxis: Aspirin 37m BID x4 weeks ?Follow up: 2 weeks after surgery for a wound check with Dr. [facility]achery [facility]  ?[address]  [address] ?Office Phone: [phone]?  ?DCharlies Constable MD ?Orthopaedic Surgery ?  ? ? ? ?Maegan Buller A Ulmer Degen ?08/01/2021, 7:26 AM ? ? ?DCharlies Constable MD ? ?Contact information:   ?Weekdays 7am-5pm epic message Dr. MZachery Dakins or call  office for patient follow up: [phone] ?After hours and holidays please check [email or web address] for group call information for [facility] ? ?  ?

## 2021-08-01 NOTE — Progress Notes (Signed)
Physical Therapy Treatment ?Patient Details ?Name: Jordan Rodgers ?MRN: [id number] ?DOB: 02/21/1956 ?Today's Date: 08/01/2021 ? ? ?History of Present Illness Patient is 66 y.o. female s/p Rt TKA on 07/31/21 with PMH significant for OA, anxiety, hypothyroidism. ? ?  ?PT Comments  ? ? POD # 1 am session ?Pt OOB in recliner.  General transfer comment: <25% VC's on proper hand placement and safety with turns. General Gait Details: 25% VC's on proper walker to self distance and increased time. Then returned to room to perform some TE's following HEP handout.  Instructed on proper tech, freq as well as use of ICE.   ?Pt will need another PT session to complete HEP.   ?  ?Recommendations for follow up therapy are one component of a multi-disciplinary discharge planning process, led by the attending physician.  Recommendations may be updated based on patient status, additional functional criteria and insurance authorization. ? ?Follow Up Recommendations ? Follow physician's recommendations for discharge plan and follow up therapies ?  ?  ?Assistance Recommended at Discharge Frequent or constant Supervision/Assistance  ?Patient can return home with the following A little help with walking and/or transfers;A little help with bathing/dressing/bathroom ?  ?Equipment Recommendations ? None recommended by PT  ?  ?Recommendations for Other Services   ? ? ?  ?Precautions / Restrictions Precautions ?Precautions: Fall ?Precaution Comments: instructed no pillow under knee ?Restrictions ?Weight Bearing Restrictions: No ?RLE Weight Bearing: Weight bearing as tolerated  ?  ? ?Mobility ? Bed Mobility ?  ?  ?  ?  ?  ?  ?  ?General bed mobility comments: OOB in recliner ?  ? ?Transfers ?Overall transfer level: Needs assistance ?Equipment used: Rolling walker (2 wheels) ?Transfers: Sit to/from Stand ?Sit to Stand: Supervision, Min guard ?  ?  ?  ?  ?  ?General transfer comment: <25% VC's on proper hand placement and safety with  turns. ?  ? ?Ambulation/Gait ?Ambulation/Gait assistance: Supervision, Min guard ?Gait Distance (Feet): 32 Feet ?Assistive device: Rolling walker (2 wheels) ?Gait Pattern/deviations: Step-to pattern, Decreased stride length, Decreased stance time - right, Decreased weight shift to right ?Gait velocity: decr ?  ?  ?General Gait Details: 25% VC's on proper walker to self distance and increased time ? ? ?Stairs ?  ?  ?  ?  ?  ? ? ?Wheelchair Mobility ?  ? ?Modified Rankin (Stroke Patients Only) ?  ? ? ?  ?Balance   ?  ?  ?  ?  ?  ?  ?  ?  ?  ?  ?  ?  ?  ?  ?  ?  ?  ?  ?  ? ?  ?Cognition Arousal/Alertness: Awake/alert ?Behavior During Therapy: [facility] for tasks assessed/performed ?Overall Cognitive Status: Within Functional Limits for tasks assessed ?  ?  ?  ?  ?  ?  ?  ?  ?  ?  ?  ?  ?  ?  ?  ?  ?General Comments: AxO x 3 very pleasant and motivated ?  ?  ? ?  ?Exercises  Total Knee Replacement TE's following HEP handout ?10 reps B LE ankle pumps ?05 reps towel squeezes ?05 reps knee presses ?05 reps heel slides  ?05 reps SAQ's ?05 reps SLR's ?05 reps ABD ?Educated on use of gait belt to assist with TE's ?Followed by ICE ? ? ?  ?General Comments   ?  ?  ? ?Pertinent Vitals/Pain Pain Assessment ?Pain Assessment: 0-10 ?Pain Score:  3  ?Pain Location: Rt knee ?Pain Descriptors / Indicators: Discomfort, Dull, Tender ?Pain Intervention(s): Monitored during session, Premedicated before session, Ice applied, Repositioned  ? ? ?Home Living   ?  ?  ?  ?  ?  ?  ?  ?  ?  ?   ?  ?Prior Function    ?  ?  ?   ? ?PT Goals (current goals can now be found in the care plan section) Progress towards PT goals: Progressing toward goals ? ?  ?Frequency ? ? ? 7X/week ? ? ? ?  ?PT Plan Current plan remains appropriate  ? ? ?Co-evaluation   ?  ?  ?  ?  ? ?  ?AM-PAC PT "6 Clicks" Mobility   ?Outcome Measure ? Help needed turning from your back to your side while in a flat bed without using bedrails?: A Little ?Help needed moving from lying on your  back to sitting on the side of a flat bed without using bedrails?: A Little ?Help needed moving to and from a bed to a chair (including a wheelchair)?: A Little ?Help needed standing up from a chair using your arms (e.g., wheelchair or bedside chair)?: A Little ?Help needed to walk in hospital room?: A Little ?Help needed climbing 3-5 steps with a railing? : A Little ?6 Click Score: 18 ? ?  ?End of Session   ?Activity Tolerance: Patient tolerated treatment well ?Patient left: in chair;with call bell/phone within reach;with chair alarm set ?Nurse Communication: Mobility status ?PT Visit Diagnosis: Muscle weakness (generalized) (M62.81);Difficulty in walking, not elsewhere classified (R26.2) ?  ? ? ?Time: 1025-8527 ?PT Time Calculation (min) (ACUTE ONLY): 45 min ? ?Charges:  $Gait Training: 8-22 mins ?$Therapeutic Exercise: 8-22 mins ?$Therapeutic Activity: 8-22 mins          ?          ? ?{Arlester Keehan  PTA ?Acute  Rehabilitation Services ?Pager      [phone] ?Office      [phone] ? ?

## 2021-08-01 NOTE — Progress Notes (Signed)
Physical Therapy Treatment ?Patient Details ?Name: Jordan Rodgers ?MRN: [id number] ?DOB: 06/06/1955 ?Today's Date: 08/01/2021 ? ? ?History of Present Illness Patient is 66 y.o. female s/p Rt TKA on 07/31/21 with PMH significant for OA, anxiety, hypothyroidism. ? ?  ?PT Comments  ? ? POD # 1 pm session ?Assisted with amb to bathroom and in hallway.  Completed HEP Instruction.  Addressed all mobility questions, discussed appropriate activity, educated on use of ICE.  Pt ready for D/C to home. ?  ?Recommendations for follow up therapy are one component of a multi-disciplinary discharge planning process, led by the attending physician.  Recommendations may be updated based on patient status, additional functional criteria and insurance authorization. ? ?Follow Up Recommendations ? Follow physician's recommendations for discharge plan and follow up therapies ?  ?  ?Assistance Recommended at Discharge Frequent or constant Supervision/Assistance  ?Patient can return home with the following A little help with walking and/or transfers;A little help with bathing/dressing/bathroom ?  ?Equipment Recommendations ? None recommended by PT  ?  ?Recommendations for Other Services   ? ? ?  ?Precautions / Restrictions Precautions ?Precautions: Fall ?Precaution Comments: instructed no pillow under knee ?Restrictions ?Weight Bearing Restrictions: No ?RLE Weight Bearing: Weight bearing as tolerated  ?  ? ?Mobility ? Bed Mobility ?  ?  ?  ?  ?  ?  ?  ?General bed mobility comments: OOB in recliner ?  ? ?Transfers ?Overall transfer level: Needs assistance ?Equipment used: Rolling walker (2 wheels) ?Transfers: Sit to/from Stand ?Sit to Stand: Supervision, Min guard ?  ?  ?  ?  ?  ?General transfer comment: <25% VC's on proper hand placement and safety with turns. ?  ? ?Ambulation/Gait ?Ambulation/Gait assistance: Supervision, Min guard ?Gait Distance (Feet): 32 Feet ?Assistive device: Rolling walker (2 wheels) ?Gait  Pattern/deviations: Step-to pattern, Decreased stride length, Decreased stance time - right, Decreased weight shift to right ?Gait velocity: decr ?  ?  ?General Gait Details: 25% VC's on proper walker to self distance and increased time ? ? ?Stairs ?  ?  ?  ?  ?  ? ? ?Wheelchair Mobility ?  ? ?Modified Rankin (Stroke Patients Only) ?  ? ? ?  ?Balance   ?  ?  ?  ?  ?  ?  ?  ?  ?  ?  ?  ?  ?  ?  ?  ?  ?  ?  ?  ? ?  ?Cognition Arousal/Alertness: Awake/alert ?Behavior During Therapy: [facility] for tasks assessed/performed ?Overall Cognitive Status: Within Functional Limits for tasks assessed ?  ?  ?  ?  ?  ?  ?  ?  ?  ?  ?  ?  ?  ?  ?  ?  ?General Comments: AxO x 3 very pleasant and motivated ?  ?  ? ?  ?Exercises   ? ?  ?General Comments   ?  ?  ? ?Pertinent Vitals/Pain Pain Assessment ?Pain Assessment: 0-10 ?Pain Score: 3  ?Pain Location: Rt knee ?Pain Descriptors / Indicators: Discomfort, Dull, Tender ?Pain Intervention(s): Monitored during session, Premedicated before session, Ice applied, Repositioned  ? ? ?Home Living   ?  ?  ?  ?  ?  ?  ?  ?  ?  ?   ?  ?Prior Function    ?  ?  ?   ? ?PT Goals (current goals can now be found in the care plan section) Progress towards PT goals:  Progressing toward goals ? ?  ?Frequency ? ? ? 7X/week ? ? ? ?  ?PT Plan Current plan remains appropriate  ? ? ?Co-evaluation   ?  ?  ?  ?  ? ?  ?AM-PAC PT "6 Clicks" Mobility   ?Outcome Measure ? Help needed turning from your back to your side while in a flat bed without using bedrails?: A Little ?Help needed moving from lying on your back to sitting on the side of a flat bed without using bedrails?: A Little ?Help needed moving to and from a bed to a chair (including a wheelchair)?: A Little ?Help needed standing up from a chair using your arms (e.g., wheelchair or bedside chair)?: A Little ?Help needed to walk in hospital room?: A Little ?Help needed climbing 3-5 steps with a railing? : A Little ?6 Click Score: 18 ? ?  ?End of Session    ?Activity Tolerance: Patient tolerated treatment well ?Patient left: in chair;with call bell/phone within reach;with chair alarm set ?Nurse Communication: Mobility status ?PT Visit Diagnosis: Muscle weakness (generalized) (M62.81);Difficulty in walking, not elsewhere classified (R26.2) ?  ? ? ?Time: 1030-1055 ?PT Time Calculation (min) (ACUTE ONLY): 25 min ? ?Charges:  $Gait Training: 8-22 mins ?$Therapeutic Exercise: 8-22 mins          ?          ? ?Rica Koyanagi  PTA ?Acute  Rehabilitation Services ?Pager      [phone] ?Office      [phone] ? ?

## 2021-08-02 LAB — NICOTINE/COTININE METABOLITES
Cotinine: 167.7 ng/mL
Nicotine: 6.5 ng/mL

## 2021-08-03 ENCOUNTER — Encounter: Payer: Self-pay | Admitting: Internal Medicine

## 2021-08-03 NOTE — Anesthesia Postprocedure Evaluation (Signed)
Anesthesia Post Note ? ?Patient: Breeana Sawtelle ? ?Procedure(s) Performed: TOTAL KNEE ARTHROPLASTY (Right: Knee) ? ?  ? ?Patient location during evaluation: PACU ?Anesthesia Type: Spinal ?Level of consciousness: awake and alert ?Pain management: pain level controlled ?Vital Signs Assessment: post-procedure vital signs reviewed and stable ?Respiratory status: spontaneous breathing and respiratory function stable ?Cardiovascular status: blood pressure returned to baseline and stable ?Postop Assessment: spinal receding ?Anesthetic complications: no ? ? ?No notable events documented. ? ?Last Vitals:  ?Vitals:  ? 08/01/21 0601 08/01/21 1000  ?BP: (!) 107/57 112/63  ?Pulse: (!) 44 (!) 45  ?Resp: 18 14  ?Temp: 36.8 ?C 37.1 ?C  ?SpO2: 93% 97%  ?  ?Last Pain:  ?Vitals:  ? 08/01/21 1000  ?TempSrc: Oral  ?PainSc:   ? ? ?  ?  ?  ?  ?  ?  ? ?Suzette Battiest E ? ? ? ? ?

## 2021-08-28 ENCOUNTER — Other Ambulatory Visit: Payer: Self-pay | Admitting: Endocrinology

## 2021-08-28 DIAGNOSIS — Z1231 Encounter for screening mammogram for malignant neoplasm of breast: Secondary | ICD-10-CM

## 2021-09-04 ENCOUNTER — Ambulatory Visit
Admission: RE | Admit: 2021-09-04 | Discharge: 2021-09-04 | Disposition: A | Discharge status: Home / Self Care | Payer: Managed Care, Other (non HMO) | Source: Ambulatory Visit | Attending: Endocrinology | Admitting: Endocrinology

## 2021-09-04 DIAGNOSIS — Z1231 Encounter for screening mammogram for malignant neoplasm of breast: Secondary | ICD-10-CM

## 2021-09-06 ENCOUNTER — Other Ambulatory Visit: Payer: Self-pay | Admitting: Endocrinology

## 2021-09-06 DIAGNOSIS — R928 Other abnormal and inconclusive findings on diagnostic imaging of breast: Secondary | ICD-10-CM

## 2021-09-12 ENCOUNTER — Ambulatory Visit
Admission: RE | Admit: 2021-09-12 | Discharge: 2021-09-12 | Disposition: A | Discharge status: Home / Self Care | Payer: Commercial Managed Care - HMO | Source: Ambulatory Visit | Attending: Endocrinology | Admitting: Endocrinology

## 2021-09-12 ENCOUNTER — Ambulatory Visit
Admission: RE | Admit: 2021-09-12 | Discharge: 2021-09-12 | Disposition: A | Discharge status: Home / Self Care | Payer: Managed Care, Other (non HMO) | Source: Ambulatory Visit | Attending: Endocrinology | Admitting: Endocrinology

## 2021-09-12 DIAGNOSIS — R928 Other abnormal and inconclusive findings on diagnostic imaging of breast: Secondary | ICD-10-CM

## 2021-09-22 ENCOUNTER — Other Ambulatory Visit: Payer: Self-pay

## 2021-09-22 ENCOUNTER — Ambulatory Visit (AMBULATORY_SURGERY_CENTER): Payer: Self-pay

## 2021-09-22 VITALS — Ht 65.0 in | Wt 175.0 lb

## 2021-09-22 DIAGNOSIS — Z1211 Encounter for screening for malignant neoplasm of colon: Secondary | ICD-10-CM

## 2021-09-22 MED ORDER — NA SULFATE-K SULFATE-MG SULF 17.5-3.13-1.6 GM/177ML PO SOLN: 1.0000 | Freq: Once | ORAL | 0 refills | Status: AC

## 2021-09-22 NOTE — Progress Notes (Signed)
Denies allergies to eggs or soy products. Denies complication of anesthesia or sedation. Denies use of weight loss medication. Denies use of O2.   Emmi instructions given for colonoscopy.  

## 2021-10-03 ENCOUNTER — Encounter: Payer: Self-pay | Admitting: Internal Medicine

## 2021-10-05 ENCOUNTER — Encounter: Payer: Self-pay | Admitting: Internal Medicine

## 2021-10-05 ENCOUNTER — Ambulatory Visit (AMBULATORY_SURGERY_CENTER): Payer: Commercial Managed Care - HMO | Admitting: Internal Medicine

## 2021-10-05 VITALS — BP 140/50 | HR 62 | Temp 97.5°F | Resp 17 | Ht 64.0 in | Wt 175.0 lb

## 2021-10-05 DIAGNOSIS — K635 Polyp of colon: Secondary | ICD-10-CM

## 2021-10-05 DIAGNOSIS — Z1211 Encounter for screening for malignant neoplasm of colon: Secondary | ICD-10-CM

## 2021-10-05 DIAGNOSIS — D123 Benign neoplasm of transverse colon: Secondary | ICD-10-CM

## 2021-10-05 DIAGNOSIS — K621 Rectal polyp: Secondary | ICD-10-CM

## 2021-10-05 DIAGNOSIS — D128 Benign neoplasm of rectum: Secondary | ICD-10-CM

## 2021-10-05 MED ORDER — SODIUM CHLORIDE 0.9 % IV SOLN: 500.0000 mL | Freq: Once | INTRAVENOUS | Status: DC

## 2021-10-05 NOTE — Progress Notes (Signed)
[facility] History and Physical   Primary Care Physician:  Reynold Bowen, MD   Reason for Procedure:   Colon cancer screening  Plan:    colonoscopy     HPI: Nyhla Rodgers is a 66 y.o. 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 Authorizing Provider  ARMOUR THYROID 60 MG tablet Take 60-120 mg by mouth See admin instructions. 120 mg Wed and Sun, and 60 mg all other days 06/28/21  Yes [provider]  Ascorbic Acid (VITAMIN C) 1000 MG tablet Take 1,000 mg by mouth daily. With Bioflavonoids 100 mg   Yes [provider]  aspirin 81 MG chewable tablet Chew 81 mg by mouth daily.   Yes [provider]  celecoxib (CELEBREX) 100 MG capsule Take 100 mg by mouth 2 (two) times daily. 05/26/20  Yes [provider]  cetirizine (ZYRTEC) 10 MG tablet Take 10 mg by mouth daily.   Yes [provider]  Cholecalciferol (VITAMIN D3) 125 MCG (5000 UT) CAPS Take 5,000 Units by mouth daily.   Yes [provider]  clonazePAM (KLONOPIN) 1 MG tablet Take 0.33 mg by mouth 2 (two) times daily. Takes 1/3 of at tablet twice daily   Yes [provider]  escitalopram (LEXAPRO) 10 MG tablet Take 10 mg by mouth daily. 05/24/20   Yes [provider]  melatonin 5 MG TABS Take 2.5-5 mg by mouth at bedtime.   Yes [provider]  metoprolol tartrate (LOPRESSOR) 25 MG tablet Take 25 mg by mouth 2 (two) times daily.   Yes [provider]  Multiple Vitamin (MULTIVITAMIN) tablet Take 2 tablets by mouth daily. Total Women's Multi   Yes [provider]  Probiotic Product (PROBIOTIC PO) Take 1 capsule by mouth daily. 5 Billion   Yes [provider]  ALPRAZolam (XANAX) 0.5 MG tablet Take 0.125 mg by mouth daily as needed for anxiety.    [provider]  Misc Natural Products (SAMBUCUS ELDERBERRY IMMUNE) SYRP Take 15 mLs by mouth daily as needed (immunity). 1 tablespoon daily as needed    [provider]    Current Outpatient Medications  Medication Sig Dispense Refill   ARMOUR THYROID 60 MG tablet Take 60-120 mg by mouth See admin instructions. 120 mg Wed and Sun, and 60 mg all other days     Ascorbic Acid (VITAMIN C) 1000 MG tablet Take 1,000 mg by mouth daily. With Bioflavonoids 100 mg     aspirin 81 MG chewable tablet Chew 81 mg by mouth daily.     celecoxib (CELEBREX) 100 MG capsule Take 100 mg by mouth 2 (  two) times daily.     cetirizine (ZYRTEC) 10 MG tablet Take 10 mg by mouth daily.     Cholecalciferol (VITAMIN D3) 125 MCG (5000 UT) CAPS Take 5,000 Units by mouth daily.     clonazePAM (KLONOPIN) 1 MG tablet Take 0.33 mg by mouth 2 (two) times daily. Takes 1/3 of at tablet twice daily     escitalopram (LEXAPRO) 10 MG tablet Take 10 mg by mouth daily.     melatonin 5 MG TABS Take 2.5-5 mg by mouth at bedtime.     metoprolol tartrate (LOPRESSOR) 25 MG tablet Take 25 mg by mouth 2 (two) times daily.     Multiple Vitamin (MULTIVITAMIN) tablet Take 2 tablets by mouth daily. Total Women's Multi     Probiotic Product (PROBIOTIC PO) Take 1 capsule by mouth daily. 5 Billion     ALPRAZolam (XANAX) 0.5 MG tablet Take 0.125 mg by mouth daily as needed for anxiety.      Misc Natural Products (SAMBUCUS ELDERBERRY IMMUNE) SYRP Take 15 mLs by mouth daily as needed (immunity). 1 tablespoon daily as needed     Current Facility-Administered Medications  Medication Dose Route Frequency Provider Last Rate Last Admin   0.9 %  sodium chloride infusion  500 mL Intravenous Once Gatha Mayer, MD        Allergies as of 10/05/2021 - Review Complete 10/05/2021  Allergen Reaction Noted   Shrimp flavor Hives 08/11/2020   Betadine [povidone-iodine] Rash 09/22/2021    Family History  Problem Relation Age of Onset   Hypertension Mother    Thyroid disease Mother    Diabetes Mother    Dementia Mother    Hypertension Father    Dementia Father        Lewy Body   Cancer Father        Prostate   Thyroid disease Maternal Aunt    Stomach cancer Maternal Uncle    Esophageal cancer Maternal Uncle    Thyroid disease Daughter    Breast cancer Neg Hx    Colon cancer Neg Hx     Social History   Socioeconomic History   Marital status: Widowed    Spouse name: Not on file   Number of children: 3   Years of education: Not on file   Highest education level: Some college, no degree  Occupational History   Not on file  Tobacco Use   Smoking status: Every Day    Packs/day: 1.00    Years: 40.00    Pack years: 40.00    Types: Cigarettes   Smokeless tobacco: Never  Vaping Use   Vaping Use: Never used  Substance and Sexual Activity   Alcohol use: Not Currently    Comment: moderate   Drug use: No   Sexual activity: Yes  Other Topics Concern   Not on file  Social History Narrative   Not on file   Social Determinants of Health   Financial Resource Strain: Not on file  Food Insecurity: Not on file  Transportation Needs: Not on file  Physical Activity: Not on file  Stress: Not on file  Social Connections: Not on file  Intimate Partner Violence: Not on file    Review of Systems:  All other review of systems negative except as mentioned in the  HPI.  Physical Exam: Vital signs BP (!) 125/55   Pulse (!) 46   Temp (!) 97.5 F (36.4 C)   Resp 18   Ht '5\' 4"'$  (1.626 m)  Wt 175 lb (79.4 kg)   SpO2 96%   BMI 30.04 kg/m   General:   Alert,  Well-developed, well-nourished, pleasant and cooperative in NAD Lungs:  Clear throughout to auscultation.   Heart:  Regular rate and rhythm; no murmurs, clicks, rubs,  or gallops. Abdomen:  Soft, nontender and nondistended. Normal bowel sounds.   Neuro/Psych:  Alert and cooperative. Normal mood and affect. A and O x 3   '@Jory Welke'$  Simonne Maffucci, MD, [facility] [phone] (pager) 10/05/2021 8:36 AM@

## 2021-10-05 NOTE — Progress Notes (Signed)
Report given to PACU, vss 

## 2021-10-05 NOTE — Op Note (Signed)
[facility] Patient Name: Jordan Rodgers Procedure Date: 10/05/2021 8:24 AM MRN: [id number] Endoscopist: Gatha Mayer , MD Age: 66 Referring MD:  Date of Birth: Sep 19, 1955 Gender: Female Account #: [id number] Procedure:                Colonoscopy Indications:              Screening for colorectal malignant neoplasm, Last                            colonoscopy: 2012 Medicines:                Monitored Anesthesia Care Procedure:                Pre-Anesthesia Assessment:                           - Prior to the procedure, a History and Physical                            was performed, and patient medications and                            allergies were reviewed. The patient's tolerance of                            previous anesthesia was also reviewed. The risks                            and benefits of the procedure and the sedation                            options and risks were discussed with the patient.                            All questions were answered, and informed consent                            was obtained. Prior Anticoagulants: The patient has                            taken no previous anticoagulant or antiplatelet                            agents. ASA Grade Assessment: II - A patient with                            mild systemic disease. After reviewing the risks                            and benefits, the patient was deemed in                            satisfactory condition to undergo the procedure.  After obtaining informed consent, the colonoscope                            was passed under direct vision. Throughout the                            procedure, the patient's blood pressure, pulse, and                            oxygen saturations were monitored continuously. The                            Olympus #[id number] was introduced through the anus                            and advanced to the the cecum,  identified by                            appendiceal orifice and ileocecal valve. The                            colonoscopy was performed without difficulty. The                            patient tolerated the procedure well. The quality                            of the bowel preparation was good. The ileocecal                            valve, appendiceal orifice, and rectum were                            photographed. The bowel preparation used was SUPREP                            via split dose instruction. Scope In: 8:45:32 AM Scope Out: 9:01:35 AM Scope Withdrawal Time: 0 hours 12 minutes 54 seconds  Total Procedure Duration: 0 hours 16 minutes 3 seconds  Findings:                 The perianal and digital rectal examinations were                            normal.                           Three sessile polyps were found in the rectum and                            transverse colon. The polyps were diminutive in                            size. These polyps were removed with a cold snare.  Resection and retrieval were complete. Verification                            of patient identification for the specimen was                            done. Estimated blood loss was minimal.                           Multiple small-mouthed diverticula were found in                            the sigmoid colon.                           The exam was otherwise without abnormality on                            direct and retroflexion views. Complications:            No immediate complications. Estimated Blood Loss:     Estimated blood loss was minimal. Impression:               - Three diminutive polyps in the rectum and in the                            transverse colon, removed with a cold snare.                            Resected and retrieved.                           - Diverticulosis in the sigmoid colon.                           - The examination was  otherwise normal on direct                            and retroflexion views. Recommendation:           - Patient has a contact number available for                            emergencies. The signs and symptoms of potential                            delayed complications were discussed with the                            patient. Return to normal activities tomorrow.                            Written discharge instructions were provided to the                            patient.                           -  Resume previous diet.                           - Continue present medications.                           - Await pathology results.                           - Repeat colonoscopy is recommended. The                            colonoscopy date will be determined after pathology                            results from today's exam become available for                            review. Gatha Mayer, MD 10/05/2021 9:08:15 AM This report has been signed electronically.

## 2021-10-05 NOTE — Progress Notes (Signed)
Called to room to assist during endoscopic procedure.  Patient ID and intended procedure confirmed with present staff. Received instructions for my participation in the procedure from the performing physician.  

## 2021-10-05 NOTE — Patient Instructions (Addendum)
I found and removed 3 tiny polyps. I will let you know pathology results and when to have another routine colonoscopy by mail and/or My Chart.  Also saw some diverticulosis.  I appreciate the opportunity to care for you. Gatha Mayer, MD, FACG  YOU HAD AN ENDOSCOPIC PROCEDURE TODAY AT [facility]:   Refer to the procedure report that was given to you for any specific questions about what was found during the examination.  If the procedure report does not answer your questions, please call your gastroenterologist to clarify.  If you requested that your care partner not be given the details of your procedure findings, then the procedure report has been included in a sealed envelope for you to review at your convenience later.  YOU SHOULD EXPECT: Some feelings of bloating in the abdomen. Passage of more gas than usual.  Walking can help get rid of the air that was put into your GI tract during the procedure and reduce the bloating. If you had a lower endoscopy (such as a colonoscopy or flexible sigmoidoscopy) you may notice spotting of blood in your stool or on the toilet paper. If you underwent a bowel prep for your procedure, you may not have a normal bowel movement for a few days.  Please Note:  You might notice some irritation and congestion in your nose or some drainage.  This is from the oxygen used during your procedure.  There is no need for concern and it should clear up in a day or so.  SYMPTOMS TO REPORT IMMEDIATELY:  Following lower endoscopy (colonoscopy or flexible sigmoidoscopy):  Excessive amounts of blood in the stool  Significant tenderness or worsening of abdominal pains  Swelling of the abdomen that is new, acute  Fever of 100F or higher   For urgent or emergent issues, a gastroenterologist can be reached at any hour by calling [phone]. Do not use MyChart messaging for urgent concerns.    DIET:  We do recommend a small meal at first, but then  you may proceed to your regular diet.  Drink plenty of fluids but you should avoid alcoholic beverages for 24 hours.  ACTIVITY:  You should plan to take it easy for the rest of today and you should NOT DRIVE or use heavy machinery until tomorrow (because of the sedation medicines used during the test).    FOLLOW UP: Our staff will call the number listed on your records 48-72 hours following your procedure to check on you and address any questions or concerns that you may have regarding the information given to you following your procedure. If we do not reach you, we will leave a message.  We will attempt to reach you two times.  During this call, we will ask if you have developed any symptoms of COVID 19. If you develop any symptoms (ie: fever, flu-like symptoms, shortness of breath, cough etc.) before then, please call [phone].  If you test positive for Covid 19 in the 2 weeks post procedure, please call and report this information to [place].    If any biopsies were taken you will be contacted by phone or by letter within the next 1-3 weeks.  Please call us at [phone] if you have not heard about the biopsies in 3 weeks.    SIGNATURES/CONFIDENTIALITY: You and/or your care partner have signed paperwork which will be entered into your electronic medical record.  These signatures attest to the fact that that the information above  on your After Visit Summary has been reviewed and is understood.  Full responsibility of the confidentiality of this discharge information lies with you and/or your care-partner.

## 2021-10-06 ENCOUNTER — Telehealth: Payer: Self-pay | Admitting: *Deleted

## 2021-10-06 NOTE — Telephone Encounter (Signed)
  Follow up Call-     10/05/2021    8:08 AM  Call back number  Post procedure Call Back phone  # [phone]  Permission to leave phone message Yes    LMOM to call back with any questions or concerns.

## 2021-10-06 NOTE — Telephone Encounter (Signed)
  Follow up Call-     10/05/2021    8:08 AM  Call back number  Post procedure Call Back phone  # [phone]  Permission to leave phone message Yes     Patient questions:  Someone kept yelling and talking into phone.  No message left.

## 2021-10-17 ENCOUNTER — Encounter: Payer: Self-pay | Admitting: Internal Medicine

## 2022-05-09 DIAGNOSIS — M7751 Other enthesopathy of right foot: Secondary | ICD-10-CM

## 2022-05-09 DIAGNOSIS — M7752 Other enthesopathy of left foot: Secondary | ICD-10-CM

## 2022-07-06 ENCOUNTER — Other Ambulatory Visit: Payer: Self-pay | Admitting: Otolaryngology

## 2022-07-06 DIAGNOSIS — J329 Chronic sinusitis, unspecified: Secondary | ICD-10-CM

## 2022-07-13 ENCOUNTER — Ambulatory Visit
Admission: RE | Admit: 2022-07-13 | Discharge: 2022-07-13 | Disposition: A | Discharge status: Home / Self Care | Payer: Commercial Managed Care - HMO | Source: Ambulatory Visit | Attending: Otolaryngology | Admitting: Otolaryngology

## 2022-07-13 DIAGNOSIS — J329 Chronic sinusitis, unspecified: Secondary | ICD-10-CM

## 2022-08-03 ENCOUNTER — Other Ambulatory Visit: Payer: Self-pay | Admitting: Endocrinology

## 2022-08-03 DIAGNOSIS — Z1231 Encounter for screening mammogram for malignant neoplasm of breast: Secondary | ICD-10-CM

## 2022-08-14 ENCOUNTER — Ambulatory Visit: Payer: Commercial Managed Care - HMO

## 2022-09-13 ENCOUNTER — Ambulatory Visit
Admission: RE | Admit: 2022-09-13 | Discharge: 2022-09-13 | Disposition: A | Discharge status: Home / Self Care | Payer: Commercial Managed Care - HMO | Source: Ambulatory Visit | Attending: Endocrinology | Admitting: Endocrinology

## 2022-09-13 DIAGNOSIS — Z1231 Encounter for screening mammogram for malignant neoplasm of breast: Secondary | ICD-10-CM

## 2023-03-20 IMAGING — CT CT HEART MORP W/ CTA COR W/ SCORE W/ CA W/CM &/OR W/O CM
2 of 13 series · 5 of 20 positions shown, 6 images · non-contrast
Comparison: None.

Addendum:
CLINICAL DATA: Dyspnea on exertion

EXAM:
Cardiac/Coronary  CTA
TECHNIQUE: The patient was scanned on a Siemens Somatoform go.Top scanner.

[Series 27: multiphase % cta coronary 0.60 · axial · 0.35mm/px · z∈[+1679,+1741]mm · 3 of 3130 slices shown, 4 images]
[im 783/3130  vessel]
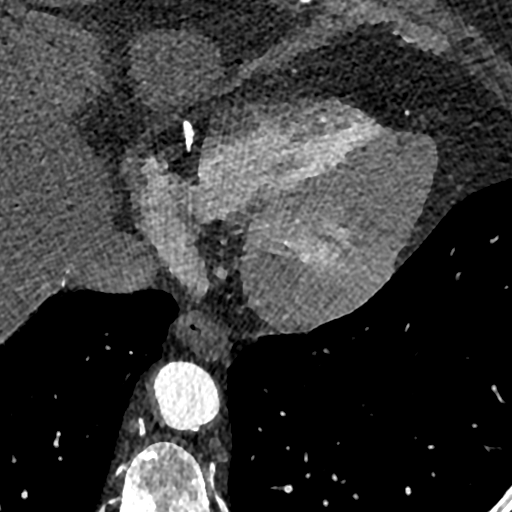
[im 783/3130  lung]
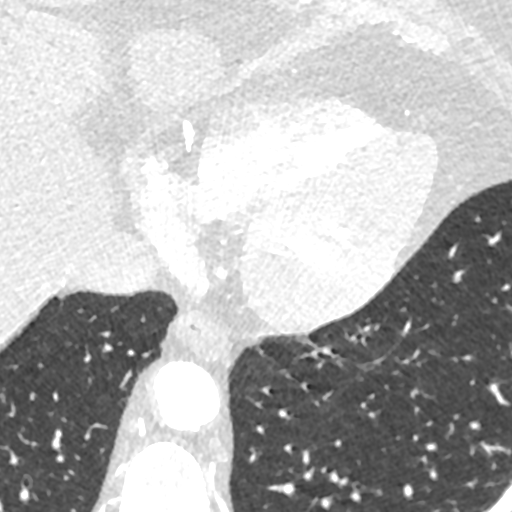
[im 1565/3130  vessel]
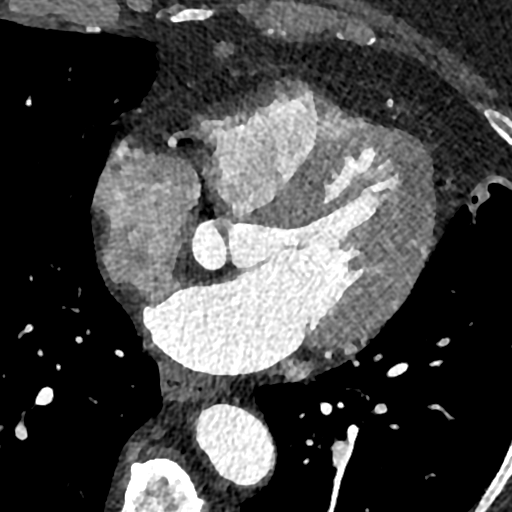
[im 2347/3130  vessel]
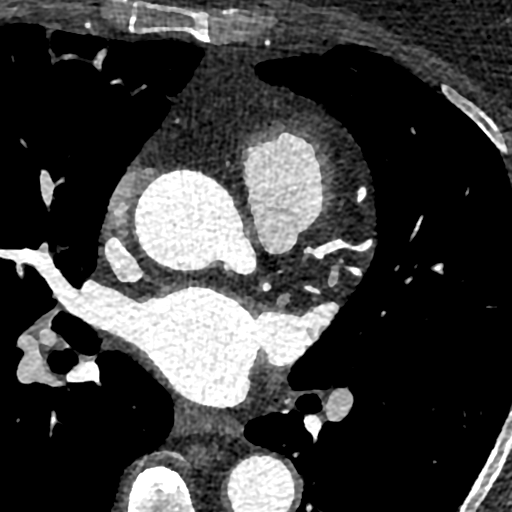

[Series 45: ms multiphase cta coronary 0.60 · axial · 0.35mm/px · z∈[+1689,+1731]mm · 2 of 2504 slices shown]
[im 835/2504  vessel]
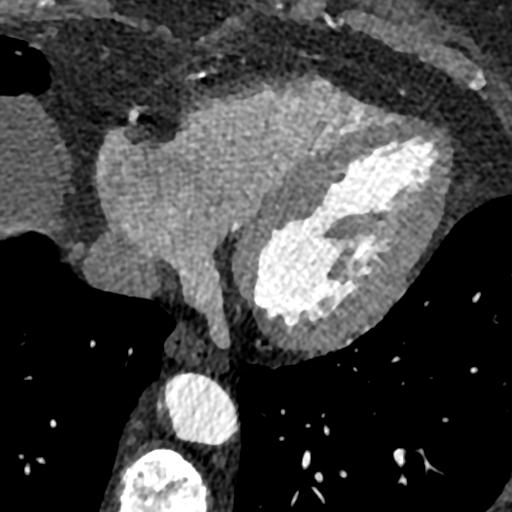
[im 1669/2504  vessel]
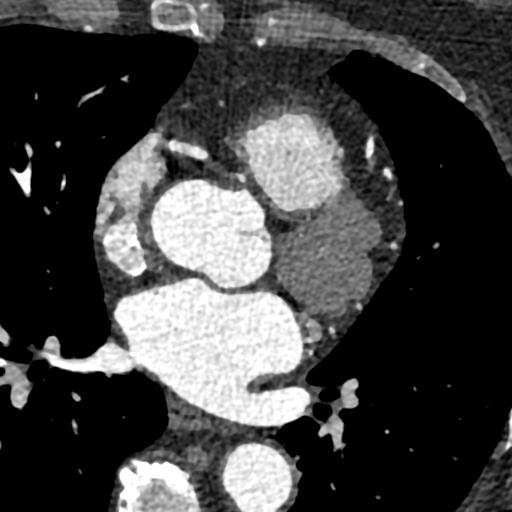

[5 of 20 positions shown; findings below may reference images not displayed]

FINDINGS: A retrospective scan was triggered in the descending thoracic aorta.
Axial non-contrast 3 mm slices were carried out through the heart.
The data set was analyzed on a dedicated work station and scored
using the Agatson method. Gantry rotation speed was 330 msecs and
collimation was .6 mm. 25 mg of metoprolol and 0.8 mg of sl NTG was
given. The 3D data set was reconstructed in 5% intervals of the
60-95 % of the R-R cycle. Diastolic phases were analyzed on a
dedicated work station using MPR, MIP and VRT modes. The patient
received 100 cc of contrast.

Aorta:  Normal size.  No calcifications.  No dissection.

Aortic Valve:  Trileaflet.  No calcifications.

Coronary Arteries:  Normal coronary origin.  Right dominance.

RCA is a large dominant artery that gives rise to PDA and PLA. There
is no plaque.

Left main is a large artery that gives rise to LAD and LCX arteries.
There is mild calcification in the LM causing minimal stenosis
(<25%).

LAD is a large vessel that has no plaque.

LCX is a non-dominant artery that gives rise to three obtuse
marginal branches. There is no plaque.

Other findings:

Normal pulmonary vein drainage into the left atrium.

Normal left atrial appendage without a thrombus.

Normal size of the pulmonary artery.
IMPRESSION: 1. Coronary calcium score of 43.7. This was 79th percentile for age
and sex matched control.

2. Normal coronary origin with right dominance.

3. Minimal calcification noted in the LM, causing <25% stenosis

4. No evidence of obstructive CAD.

5. CAD-RADS 1. Minimal non-obstructive CAD (0-24%). Consider
non-atherosclerotic causes of chest pain. Consider preventive
therapy and risk factor modification.

EXAM:
OVER-READ INTERPRETATION  CT CHEST

The following report is an over-read performed by radiologist Dr.
does not include interpretation of cardiac or coronary anatomy or
pathology. The coronary calcium score/coronary CTA interpretation by
the cardiologist is attached.
FINDINGS: The visualized portions of the lower lung fields show no suspicious
nodules, masses, or infiltrates. No pleural fluid seen.

The visualized portions of the mediastinum and chest wall are
unremarkable.
IMPRESSION: No significant non-cardiovascular abnormality seen in visualized
portion of the thorax.

*** End of Addendum ***
FINDINGS: A retrospective scan was triggered in the descending thoracic aorta.
Axial non-contrast 3 mm slices were carried out through the heart.
The data set was analyzed on a dedicated work station and scored
using the Agatson method. Gantry rotation speed was 330 msecs and
collimation was .6 mm. 25 mg of metoprolol and 0.8 mg of sl NTG was
given. The 3D data set was reconstructed in 5% intervals of the
60-95 % of the R-R cycle. Diastolic phases were analyzed on a
dedicated work station using MPR, MIP and VRT modes. The patient
received 100 cc of contrast.

Aorta:  Normal size.  No calcifications.  No dissection.

Aortic Valve:  Trileaflet.  No calcifications.

Coronary Arteries:  Normal coronary origin.  Right dominance.

RCA is a large dominant artery that gives rise to PDA and PLA. There
is no plaque.

Left main is a large artery that gives rise to LAD and LCX arteries.
There is mild calcification in the LM causing minimal stenosis
(<25%).

LAD is a large vessel that has no plaque.

LCX is a non-dominant artery that gives rise to three obtuse
marginal branches. There is no plaque.

Other findings:

Normal pulmonary vein drainage into the left atrium.

Normal left atrial appendage without a thrombus.

Normal size of the pulmonary artery.
IMPRESSION: 1. Coronary calcium score of 43.7. This was 79th percentile for age
and sex matched control.

2. Normal coronary origin with right dominance.

3. Minimal calcification noted in the LM, causing <25% stenosis

4. No evidence of obstructive CAD.

5. CAD-RADS 1. Minimal non-obstructive CAD (0-24%). Consider
non-atherosclerotic causes of chest pain. Consider preventive
therapy and risk factor modification.

## 2023-06-27 ENCOUNTER — Emergency Department: Payer: Medicare Other

## 2023-06-27 ENCOUNTER — Other Ambulatory Visit: Payer: Self-pay

## 2023-06-27 ENCOUNTER — Encounter: Payer: Self-pay | Admitting: Emergency Medicine

## 2023-06-27 DIAGNOSIS — Z716 Tobacco abuse counseling: Secondary | ICD-10-CM | POA: Diagnosis not present

## 2023-06-27 DIAGNOSIS — R0602 Shortness of breath: Secondary | ICD-10-CM | POA: Diagnosis present

## 2023-06-27 DIAGNOSIS — F17200 Nicotine dependence, unspecified, uncomplicated: Secondary | ICD-10-CM | POA: Diagnosis not present

## 2023-06-27 DIAGNOSIS — J441 Chronic obstructive pulmonary disease with (acute) exacerbation: Secondary | ICD-10-CM | POA: Insufficient documentation

## 2023-06-27 LAB — CBC
HCT: 40.4 % (ref 36.0–46.0)
Hemoglobin: 13.8 g/dL (ref 12.0–15.0)
MCH: 29.6 pg (ref 26.0–34.0)
MCHC: 34.2 g/dL (ref 30.0–36.0)
MCV: 86.7 fL (ref 80.0–100.0)
Platelets: 231 10*3/uL (ref 150–400)
RBC: 4.66 MIL/uL (ref 3.87–5.11)
RDW: 12.4 % (ref 11.5–15.5)
WBC: 7.6 10*3/uL (ref 4.0–10.5)
nRBC: 0 % (ref 0.0–0.2)

## 2023-06-27 LAB — BASIC METABOLIC PANEL
Anion gap: 11 (ref 5–15)
BUN: 20 mg/dL (ref 8–23)
CO2: 24 mmol/L (ref 22–32)
Calcium: 9.2 mg/dL (ref 8.9–10.3)
Chloride: 105 mmol/L (ref 98–111)
Creatinine, Ser: 0.67 mg/dL (ref 0.44–1.00)
GFR, Estimated: >60 mL/min (ref >60)
Glucose, Bld: 130 mg/dL — ABNORMAL HIGH (ref 70–99)
Potassium: 3.9 mmol/L (ref 3.5–5.1)
Sodium: 140 mmol/L (ref 135–145)

## 2023-06-27 LAB — RESP PANEL BY RT-PCR (RSV, FLU A&B, COVID)  RVPGX2
Influenza A by PCR: NEGATIVE
Influenza B by PCR: NEGATIVE
Resp Syncytial Virus by PCR: NEGATIVE
SARS Coronavirus 2 by RT PCR: NEGATIVE

## 2023-06-27 LAB — TROPONIN I (HIGH SENSITIVITY): Troponin I (High Sensitivity): 10 ng/L (ref <18)

## 2023-06-27 MED ORDER — IPRATROPIUM-ALBUTEROL 0.5-2.5 (3) MG/3ML IN SOLN
3.0000 mL | Freq: Once | RESPIRATORY_TRACT | Status: AC
  Administered 2023-06-27: 3 mL via RESPIRATORY_TRACT
  Filled 2023-06-27: qty 3

## 2023-06-27 NOTE — ED Triage Notes (Signed)
Patient presents pov to ed complaining of shortness of breath and chest pain  for a week,  patient recently diagnosed with bronchitis,  today increasing chest pain and shortness of breath.

## 2023-06-28 ENCOUNTER — Emergency Department
Admission: EM | Admit: 2023-06-28 | Discharge: 2023-06-28 | Disposition: A | Discharge status: Home / Self Care | Payer: Medicare Other | Attending: Emergency Medicine | Admitting: Emergency Medicine

## 2023-06-28 DIAGNOSIS — J441 Chronic obstructive pulmonary disease with (acute) exacerbation: Secondary | ICD-10-CM

## 2023-06-28 DIAGNOSIS — Z716 Tobacco abuse counseling: Secondary | ICD-10-CM

## 2023-06-28 DIAGNOSIS — R0602 Shortness of breath: Secondary | ICD-10-CM

## 2023-06-28 LAB — TROPONIN I (HIGH SENSITIVITY): Troponin I (High Sensitivity): 9 ng/L (ref <18)

## 2023-06-28 LAB — BRAIN NATRIURETIC PEPTIDE: B Natriuretic Peptide: 63.7 pg/mL (ref 0.0–100.0)

## 2023-06-28 MED ORDER — AEROCHAMBER MV MISC: 0 refills | Status: AC

## 2023-06-28 MED ORDER — ALBUTEROL SULFATE HFA 108 (90 BASE) MCG/ACT IN AERS
2.0000 | INHALATION_SPRAY | Freq: Four times a day (QID) | RESPIRATORY_TRACT | 2 refills | Status: AC | PRN
Start: 2023-06-28 — End: ?

## 2023-06-28 MED ORDER — NICOTINE 14 MG/24HR TD PT24: 14.0000 mg | MEDICATED_PATCH | TRANSDERMAL | 0 refills | Status: AC

## 2023-06-28 MED ORDER — NICOTINE POLACRILEX 4 MG MT LOZG: 4.0000 mg | LOZENGE | OROMUCOSAL | 0 refills | Status: AC | PRN

## 2023-06-28 MED ORDER — PREDNISONE 20 MG PO TABS
60.0000 mg | ORAL_TABLET | Freq: Once | ORAL | Status: AC
  Administered 2023-06-28: 60 mg via ORAL
  Filled 2023-06-28: qty 3

## 2023-06-28 MED ORDER — PREDNISONE 50 MG PO TABS
50.0000 mg | ORAL_TABLET | Freq: Every day | ORAL | 0 refills | Status: AC
Start: 2023-06-28 — End: 2023-07-02

## 2023-06-28 NOTE — Discharge Instructions (Addendum)
Use your inhaler as prescribed, 2 puffs every 4-6 hours as needed for shortness of breath.  Take prednisone for the full course as prescribed.  Follow-up with your doctor this week.   Thank you for choosing [place] for your health care today!  Please see your primary doctor this week for a follow up appointment.   If you have any new, worsening, or unexpected symptoms call your doctor right away or come back to the emergency department for reevaluation.  It was my pleasure to care for you today.   Daneil Dan [place] Charon, MD

## 2023-06-28 NOTE — ED Provider Notes (Signed)
[facility] Provider Note    Event Date/Time   First MD Initiated Contact with Patient 06/28/23 0120     (approximate)   History   Chest Pain and Shortness of Breath   HPI  Jordan Rodgers Demita Tobia is a 68 y.o. female   Past medical history of 1 pack/day smoker who presents to the emergency department with shortness of breath for 1 week.  Also productive cough.  Got antibiotics last week and finish and still has difficulty breathing.  She denies fever or chills.  No chest pain.  Does feel chest tightness.  She has no history of blood clots, no leg symptoms no immobilization  Independent Historian contributed to assessment above: Her partner is at bedside to corroborate information past medical history as above    Physical Exam   Triage Vital Signs: ED Triage Vitals  Encounter Vitals Group     BP 06/27/23 2037 (!) 162/69     Systolic BP Percentile --      Diastolic BP Percentile --      Pulse Rate 06/27/23 2037 68     Resp 06/27/23 2037 20     Temp 06/27/23 2037 98.3 F (36.8 C)     Temp Source 06/27/23 2037 Oral     SpO2 06/27/23 2037 92 %     Weight 06/27/23 2038 217 lb (98.4 kg)     Height 06/27/23 2038 5\' 5"  (1.651 m)     Head Circumference --      Peak Flow --      Pain Score 06/27/23 2038 6     Pain Loc --      Pain Education --      Exclude from Growth Chart --     Most recent vital signs: Vitals:   06/27/23 2102 06/28/23 0058  BP:  (!) 142/70  Pulse: 61 66  Resp:  17  Temp:    SpO2: 96% 94%    General: Awake, no distress.  CV:  Good peripheral perfusion.  Resp:  Normal effort.  Abd:  No distention.  Other:  Breathing comfortably with scant wheezing diffusely no focality.  Oxygenation is normal on room air.   ED Results / Procedures / Treatments   Labs (all labs ordered are listed, but only abnormal results are displayed) Labs Reviewed  BASIC METABOLIC PANEL - Abnormal; Notable for the following components:       Result Value   Glucose, Bld 130 (*)    All other components within normal limits  RESP PANEL BY RT-PCR (RSV, FLU A&B, COVID)  [id number]  CBC  BRAIN NATRIURETIC PEPTIDE  TROPONIN I (HIGH SENSITIVITY)  TROPONIN I (HIGH SENSITIVITY)     I ordered and reviewed the above labs they are notable for serial troponins negative.  EKG  ED ECG REPORT I, Pilar Jarvis, the attending physician, personally viewed and interpreted this ECG.   Date: 06/28/2023  EKG Time: 2041  Rate: 57  Rhythm: sinus bradycardia  Axis: nl  Intervals:none  ST&T Change: no stemi    RADIOLOGY I independently reviewed and interpreted chest x-ray and see no obvious focality or pneumothorax I also reviewed radiologist's formal read.   PROCEDURES:  Critical Care performed: No  Procedures   MEDICATIONS ORDERED IN ED: Medications  ipratropium-albuterol (DUONEB) 0.5-2.5 (3) MG/3ML nebulizer solution 3 mL (3 mLs Nebulization Given 06/27/23 2052)  predniSONE (DELTASONE) tablet 60 mg (60 mg Oral Given 06/28/23 0200)   IMPRESSION / MDM / ASSESSMENT AND PLAN /  ED COURSE  I reviewed the triage vital signs and the nursing notes.                                Patient's presentation is most consistent with acute presentation with potential threat to life or bodily function.  Differential diagnosis includes, but is not limited to, asthma or COPD exacerbation, respiratory infection, PE or ACS, pneumothorax   The patient is on the cardiac monitor to evaluate for evidence of arrhythmia and/or significant heart rate changes.  MDM:    I think she is having a COPD versus asthma exacerbation given her complete response to DuoNeb treatment.  She feels 100% back to normal and breathing has completely improved after getting a single DuoNeb treatment.  She has still some scant wheezing.  Labs and chest x-ray not indicative of bacterial infection requiring antibiotics at this time.  Very low clinical suspicion for ACS or PE.  Will  give prednisone burst, prescription for inhaler, nicotine replacement have her follow-up with PMD.  I spent 5 minutes counseling this patient on smoking cessation.  We spoke about the patient's current tobacco use, impact of smoking, assessed willingness to quit, methods for cessation including medical management and nicotine replacement therapy (which I prescribed to the patient) and advised follow-up with primary doctor to continue to address smoking cessation.          FINAL CLINICAL IMPRESSION(S) / ED DIAGNOSES   Final diagnoses:  Shortness of breath  COPD exacerbation (HCC)  Encounter for smoking cessation counseling     Rx / DC Orders   ED Discharge Orders          Ordered    predniSONE (DELTASONE) 50 MG tablet  Daily        06/28/23 0154    albuterol (VENTOLIN HFA) 108 (90 Base) MCG/ACT inhaler  Every 6 hours PRN        06/28/23 0154    Spacer/Aero-Holding Chambers (AEROCHAMBER MV) inhaler        06/28/23 0154    nicotine (NICODERM CQ - DOSED IN MG/24 HOURS) 14 mg/24hr patch  Every 24 hours        06/28/23 0154    nicotine polacrilex (NICOTINE MINI) 4 MG lozenge  As needed        06/28/23 0154             Note:  This document was prepared using Dragon voice recognition software and may include unintentional dictation errors.    Pilar Jarvis, MD 06/28/23 [phone]

## 2023-10-02 ENCOUNTER — Other Ambulatory Visit: Payer: Self-pay | Admitting: Endocrinology

## 2023-10-02 DIAGNOSIS — Z1231 Encounter for screening mammogram for malignant neoplasm of breast: Secondary | ICD-10-CM

## 2023-10-03 ENCOUNTER — Ambulatory Visit
Admission: RE | Admit: 2023-10-03 | Discharge: 2023-10-03 | Disposition: A | Discharge status: Home / Self Care | Source: Ambulatory Visit | Attending: Endocrinology | Admitting: Endocrinology

## 2023-10-03 ENCOUNTER — Ambulatory Visit

## 2023-10-03 DIAGNOSIS — Z1231 Encounter for screening mammogram for malignant neoplasm of breast: Secondary | ICD-10-CM
